# Patient Record
Sex: Male | Born: 1950 | Race: White | Hispanic: No | Marital: Married | Smoking: Never smoker
Health system: Southern US, Community
[De-identification: ages and names within clinical notes are randomized; demographics above are authoritative.]

## PROBLEM LIST (undated history)

## (undated) DIAGNOSIS — R011 Cardiac murmur, unspecified: Secondary | ICD-10-CM

## (undated) DIAGNOSIS — E119 Type 2 diabetes mellitus without complications: Secondary | ICD-10-CM

## (undated) DIAGNOSIS — M549 Dorsalgia, unspecified: Secondary | ICD-10-CM

## (undated) DIAGNOSIS — M1990 Unspecified osteoarthritis, unspecified site: Secondary | ICD-10-CM

## (undated) DIAGNOSIS — K219 Gastro-esophageal reflux disease without esophagitis: Secondary | ICD-10-CM

## (undated) DIAGNOSIS — G8929 Other chronic pain: Secondary | ICD-10-CM

## (undated) DIAGNOSIS — E785 Hyperlipidemia, unspecified: Secondary | ICD-10-CM

## (undated) DIAGNOSIS — I2510 Atherosclerotic heart disease of native coronary artery without angina pectoris: Secondary | ICD-10-CM

## (undated) DIAGNOSIS — C4490 Unspecified malignant neoplasm of skin, unspecified: Secondary | ICD-10-CM

## (undated) DIAGNOSIS — K449 Diaphragmatic hernia without obstruction or gangrene: Secondary | ICD-10-CM

## (undated) DIAGNOSIS — K279 Peptic ulcer, site unspecified, unspecified as acute or chronic, without hemorrhage or perforation: Secondary | ICD-10-CM

## (undated) DIAGNOSIS — I10 Essential (primary) hypertension: Secondary | ICD-10-CM

## (undated) HISTORY — PX: CORONARY ANGIOPLASTY WITH STENT PLACEMENT: SHX49

## (undated) HISTORY — DX: Gastro-esophageal reflux disease without esophagitis: K21.9

## (undated) HISTORY — PX: CORONARY ANGIOPLASTY: SHX604

## (undated) HISTORY — PX: ANAL FISSURE REPAIR: SHX2312

## (undated) HISTORY — PX: LIVER BIOPSY: SHX301

## (undated) HISTORY — PX: LAPAROSCOPIC CHOLECYSTECTOMY: SUR755

## (undated) HISTORY — PX: TONSILLECTOMY: SUR1361

## (undated) HISTORY — DX: Diaphragmatic hernia without obstruction or gangrene: K44.9

## (undated) HISTORY — PX: FRACTURE SURGERY: SHX138

## (undated) HISTORY — DX: Peptic ulcer, site unspecified, unspecified as acute or chronic, without hemorrhage or perforation: K27.9

## (undated) HISTORY — PX: BONY PELVIS SURGERY: SHX572

## (undated) HISTORY — DX: Atherosclerotic heart disease of native coronary artery without angina pectoris: I25.10

## (undated) HISTORY — DX: Hyperlipidemia, unspecified: E78.5

---

## 1983-12-13 HISTORY — PX: WRIST FRACTURE SURGERY: SHX121

## 1998-11-26 HISTORY — PX: CORONARY ARTERY BYPASS GRAFT: SHX141

## 1999-12-14 ENCOUNTER — Encounter: Payer: Self-pay | Admitting: Cardiology

## 1999-12-15 ENCOUNTER — Inpatient Hospital Stay (HOSPITAL_COMMUNITY): Admission: RE | Admit: 1999-12-15 | Discharge: 1999-12-22 | Payer: Self-pay | Admitting: Cardiology

## 1999-12-17 ENCOUNTER — Encounter: Payer: Self-pay | Admitting: Cardiology

## 1999-12-18 ENCOUNTER — Encounter: Payer: Self-pay | Admitting: Thoracic Surgery (Cardiothoracic Vascular Surgery)

## 1999-12-19 ENCOUNTER — Encounter: Payer: Self-pay | Admitting: Thoracic Surgery (Cardiothoracic Vascular Surgery)

## 1999-12-20 ENCOUNTER — Encounter: Payer: Self-pay | Admitting: Thoracic Surgery (Cardiothoracic Vascular Surgery)

## 2000-01-21 ENCOUNTER — Encounter
Admission: RE | Admit: 2000-01-21 | Discharge: 2000-04-20 | Payer: Self-pay | Admitting: Thoracic Surgery (Cardiothoracic Vascular Surgery)

## 2001-05-27 ENCOUNTER — Ambulatory Visit (HOSPITAL_COMMUNITY): Admission: RE | Admit: 2001-05-27 | Discharge: 2001-05-27 | Payer: Self-pay | Admitting: Internal Medicine

## 2001-09-09 ENCOUNTER — Ambulatory Visit (HOSPITAL_COMMUNITY): Admission: RE | Admit: 2001-09-09 | Discharge: 2001-09-09 | Payer: Self-pay | Admitting: Cardiology

## 2001-09-09 ENCOUNTER — Encounter: Payer: Self-pay | Admitting: Cardiology

## 2002-04-23 ENCOUNTER — Encounter: Payer: Self-pay | Admitting: Internal Medicine

## 2002-04-23 ENCOUNTER — Observation Stay (HOSPITAL_COMMUNITY): Admission: EM | Admit: 2002-04-23 | Discharge: 2002-04-24 | Payer: Self-pay | Admitting: Family Medicine

## 2002-04-29 ENCOUNTER — Observation Stay (HOSPITAL_COMMUNITY): Admission: RE | Admit: 2002-04-29 | Discharge: 2002-04-30 | Payer: Self-pay | Admitting: General Surgery

## 2002-04-29 ENCOUNTER — Encounter (INDEPENDENT_AMBULATORY_CARE_PROVIDER_SITE_OTHER): Payer: Self-pay | Admitting: Specialist

## 2002-04-29 ENCOUNTER — Encounter: Payer: Self-pay | Admitting: General Surgery

## 2003-09-02 ENCOUNTER — Ambulatory Visit (HOSPITAL_COMMUNITY): Admission: RE | Admit: 2003-09-02 | Discharge: 2003-09-02 | Payer: Self-pay | Admitting: Family Medicine

## 2003-09-26 ENCOUNTER — Emergency Department (HOSPITAL_COMMUNITY): Admission: EM | Admit: 2003-09-26 | Discharge: 2003-09-26 | Payer: Self-pay | Admitting: Emergency Medicine

## 2003-10-03 ENCOUNTER — Ambulatory Visit (HOSPITAL_COMMUNITY): Admission: RE | Admit: 2003-10-03 | Discharge: 2003-10-03 | Payer: Self-pay | Admitting: Family Medicine

## 2003-12-27 ENCOUNTER — Ambulatory Visit: Payer: Self-pay | Admitting: Cardiology

## 2004-09-27 ENCOUNTER — Ambulatory Visit (HOSPITAL_COMMUNITY): Admission: RE | Admit: 2004-09-27 | Discharge: 2004-09-27 | Payer: Self-pay | Admitting: Family Medicine

## 2004-12-17 ENCOUNTER — Ambulatory Visit: Payer: Self-pay | Admitting: Cardiology

## 2005-12-06 ENCOUNTER — Ambulatory Visit: Payer: Self-pay | Admitting: Cardiology

## 2005-12-16 ENCOUNTER — Ambulatory Visit: Payer: Self-pay

## 2006-10-20 ENCOUNTER — Ambulatory Visit: Payer: Self-pay | Admitting: Cardiology

## 2006-12-22 ENCOUNTER — Encounter: Payer: Self-pay | Admitting: Internal Medicine

## 2007-01-30 ENCOUNTER — Encounter: Payer: Self-pay | Admitting: Internal Medicine

## 2007-11-19 ENCOUNTER — Ambulatory Visit: Payer: Self-pay | Admitting: Cardiology

## 2007-12-15 ENCOUNTER — Ambulatory Visit: Payer: Self-pay | Admitting: Cardiology

## 2007-12-15 ENCOUNTER — Ambulatory Visit: Payer: Self-pay

## 2007-12-15 LAB — CONVERTED CEMR LAB
BUN: 20 mg/dL (ref 6–23)
Chloride: 102 meq/L (ref 96–112)
Eosinophils Relative: 1.9 % (ref 0.0–5.0)
Glucose, Bld: 161 mg/dL — ABNORMAL HIGH (ref 70–99)
INR: 1 (ref 0.8–1.0)
Lymphocytes Relative: 22.6 % (ref 12.0–46.0)
Monocytes Relative: 10 % (ref 3.0–12.0)
Neutrophils Relative %: 65.4 % (ref 43.0–77.0)
Platelets: 266 10*3/uL (ref 150–400)
Potassium: 3.6 meq/L (ref 3.5–5.1)
WBC: 6.8 10*3/uL (ref 4.5–10.5)

## 2007-12-16 ENCOUNTER — Ambulatory Visit: Payer: Self-pay | Admitting: Cardiology

## 2007-12-16 ENCOUNTER — Inpatient Hospital Stay (HOSPITAL_BASED_OUTPATIENT_CLINIC_OR_DEPARTMENT_OTHER): Admission: RE | Admit: 2007-12-16 | Discharge: 2007-12-16 | Payer: Self-pay | Admitting: Cardiology

## 2008-01-04 ENCOUNTER — Ambulatory Visit: Payer: Self-pay | Admitting: Cardiology

## 2008-08-31 ENCOUNTER — Encounter: Payer: Self-pay | Admitting: Cardiology

## 2008-09-27 ENCOUNTER — Encounter (INDEPENDENT_AMBULATORY_CARE_PROVIDER_SITE_OTHER): Payer: Self-pay | Admitting: *Deleted

## 2008-10-04 ENCOUNTER — Ambulatory Visit (HOSPITAL_COMMUNITY): Admission: RE | Admit: 2008-10-04 | Discharge: 2008-10-04 | Payer: Self-pay | Admitting: Advanced Practice Midwife

## 2008-11-21 DIAGNOSIS — Z8711 Personal history of peptic ulcer disease: Secondary | ICD-10-CM

## 2008-11-21 DIAGNOSIS — K449 Diaphragmatic hernia without obstruction or gangrene: Secondary | ICD-10-CM | POA: Insufficient documentation

## 2008-11-21 DIAGNOSIS — E785 Hyperlipidemia, unspecified: Secondary | ICD-10-CM

## 2008-11-21 DIAGNOSIS — K219 Gastro-esophageal reflux disease without esophagitis: Secondary | ICD-10-CM

## 2008-11-21 DIAGNOSIS — E118 Type 2 diabetes mellitus with unspecified complications: Secondary | ICD-10-CM

## 2008-11-23 ENCOUNTER — Telehealth (INDEPENDENT_AMBULATORY_CARE_PROVIDER_SITE_OTHER): Payer: Self-pay | Admitting: *Deleted

## 2008-12-16 ENCOUNTER — Ambulatory Visit: Payer: Self-pay | Admitting: Cardiology

## 2008-12-16 DIAGNOSIS — I10 Essential (primary) hypertension: Secondary | ICD-10-CM | POA: Insufficient documentation

## 2009-05-04 ENCOUNTER — Telehealth (INDEPENDENT_AMBULATORY_CARE_PROVIDER_SITE_OTHER): Payer: Self-pay | Admitting: *Deleted

## 2009-11-15 ENCOUNTER — Telehealth: Payer: Self-pay | Admitting: Cardiology

## 2009-11-20 ENCOUNTER — Telehealth: Payer: Self-pay | Admitting: Cardiology

## 2009-12-11 ENCOUNTER — Ambulatory Visit: Payer: Self-pay | Admitting: Cardiology

## 2009-12-12 LAB — CONVERTED CEMR LAB
BUN: 22 mg/dL (ref 6–23)
Bilirubin, Direct: 0.1 mg/dL (ref 0.0–0.3)
Chloride: 103 meq/L (ref 96–112)
Cholesterol: 141 mg/dL (ref 0–200)
Glucose, Bld: 148 mg/dL — ABNORMAL HIGH (ref 70–99)
HDL: 36.9 mg/dL — ABNORMAL LOW (ref >39.00)
Hgb A1c MFr Bld: 7.5 % — ABNORMAL HIGH (ref 4.6–6.5)
LDL Cholesterol: 78 mg/dL (ref 0–99)
Potassium: 4.4 meq/L (ref 3.5–5.1)
Total Protein: 6.5 g/dL (ref 6.0–8.3)
VLDL: 26.6 mg/dL (ref 0.0–40.0)

## 2009-12-14 ENCOUNTER — Ambulatory Visit: Payer: Self-pay | Admitting: Cardiology

## 2010-03-27 NOTE — Progress Notes (Signed)
Summary: re labwork   Phone Note Call from Patient   Caller: Spouse [phone] Reason for Call: Talk to Nurse Summary of Call: pt's wife, Lowella Bandy, called to set up appt/wants to know if he needs labwork as well  Initial call taken by: Glynda Jaeger,  November 15, 2009 2:11 PM  Follow-up for Phone Call        I spoke with the pt's wife and the pt's PCP Dr Nobie Putnam retired.  The pt is not following with a PCP at this time.   The pt will need a FASTING Lipid and Liver profile prior to his appt with Dr Daleen Squibb.  I will forward this message to Dr Daleen Squibb to see if he would like to order a BMP, HgbA1C or any other labs prior to appt.  The pt will come into the office a few days prior to appt for labs.  Follow-up by: Julieta Gutting, RN, BSN,  November 15, 2009 2:42 PM     Appended Document: re labwork ok with me

## 2010-03-27 NOTE — Progress Notes (Signed)
   Recieved requeast from [employer] for records forwarded to [employer] for [employer]  May 04, 2009 8:50 AM

## 2010-03-27 NOTE — Progress Notes (Signed)
Summary: RX REFILLS   Phone Note Call from Patient Call back at Home Phone [phone]   Caller: PT Reason for Call: Refill Medication Summary of Call: PT NEEDS ALL RX CALLED TO [facility]. TRANDOLAPRIL 4MG , HCTZ 12.5MG , TRICARE 145MG , ATENOLOL 50MG  ALL FOR #90 DAYS Initial call taken by: Faythe Ghee,  November 20, 2009 2:38 PM  Follow-up for Phone Call        Pt aware. Mylo Red RN    Prescriptions: HYDROCHLOROTHIAZIDE 12.5 MG CAPS (HYDROCHLOROTHIAZIDE) 1 cap once daily  #90 x 3   Entered by:   Lisabeth Devoid RN   Authorized by:   Gaylord Shih, MD, [facility]   Signed by:   Lisabeth Devoid RN on 11/20/2009   Method used:   Faxed to ...       [facility] (mail-order)             , [place]         Ph: [phone]       Fax: [phone]   RxID:   [id number] ATENOLOL 50 MG TABS (ATENOLOL) 1 tab once daily  #90 x 3   Entered by:   Lisabeth Devoid RN   Authorized by:   Gaylord Shih, MD, [facility]   Signed by:   Lisabeth Devoid RN on 11/20/2009   Method used:   Faxed to ...       [facility] (mail-order)             , [place]         Ph: [phone]       Fax: [phone]   RxID:   [id number] MAVIK 4 MG TABS (TRANDOLAPRIL) 1 tab once daily  #90 x 3   Entered by:   Lisabeth Devoid RN   Authorized by:   Gaylord Shih, MD, [facility]   Signed by:   Lisabeth Devoid RN on 11/20/2009   Method used:   Faxed to ...       [facility] (mail-order)             , [place]         Ph: [phone]       Fax: [phone]   RxID:   [id number] TRICOR 145 MG TABS (FENOFIBRATE) 1 tab once daily  #90 x 3   Entered by:   Lisabeth Devoid RN   Authorized by:   Gaylord Shih, MD, [facility]   Signed by:   Lisabeth Devoid RN on 11/20/2009   Method used:   Faxed to ...       [facility] (mail-order)             , [place]         Ph: [phone]       Fax: [phone]   RxID:   [id number]

## 2010-03-27 NOTE — Assessment & Plan Note (Signed)
Summary: PER CHECK OUT/SF  Medications Added GLIPIZIDE XL 5 MG XR24H-TAB (GLIPIZIDE) 1 tab once daily METFORMIN HCL 500 MG TABS (METFORMIN HCL) 1 tab two times a day        Visit Type:  1 yr f/u Primary Deneka Greenwalt:  Dorthey Sawyer  CC:  pt offers no cardiac complaints today.  History of Present Illness: Mr. Vancuren comes in today for followup and management of his coronary disease, history of coronary bypass surgery, mixed hyperlipidemia.  He's having no angina or ischemic symptoms. He is active he running. His weights gotten up a little bit.  Lipid profile on the Oct 17 showed a  total cholesterol 141, triglycerides 133, HDL 36.9, LDL 78. LFTs were normal. Hemoglobin A1c was suboptimal at 7.5%. Electrolytes and LFTs were within normal limits.  Clinical Reports Reviewed:  Cardiac Cath:  12/16/2007: Cardiac Cath Findings:   ASSESSMENT:  Severe native vessel coronary artery disease with patent   vein graft to the obtuse marginal, sequential vein graft to an acute   marginal and the posterior descending artery, and left internal mammary   artery to the left anterior descending.  The patient did have a positive   stress test with some ischemia in the septal area.  The only area that   looks from the angiography like it possibly could be contributing to   ischemia would be the area supplied by the 2 small-to-medium diagonals   and several septal perforators that are proximal to the 70% stenosis in   the mid left anterior descending that is itself just proximal to the   left internal mammary artery touchdown.  As the patient is not having   very significant symptoms, I think it would be reasonable to continue   medical management at   this point.  If his symptoms do become intractable, it may be possible   to perform percutaneous coronary intervention on the mid left anterior   descending lesion via the left internal mammary artery to try to provide   better flow to the 2 diagonals.  The  plan for now will be continued   medical management.               Marca Ancona, MD   Electronically Signed   12/14/1999: Cardiac Cath Findings:  Conclusions: 1. Preserved left ventricular function. 2. No evidence of significant restenosis at the previous stent site. 3. Significant progression of disease with three-vessel coronary artery disease.  Disposition: The patient has diabetes. The LAD is segmentally and diffusely diseased, and the target vessel revascularization would be extremely high if this were stented. I reviewed the films with Dr. Andee Lineman and Dr. Gerri Spore. Based on our discussions, we would favor revascularization surgery.  Shawnie Pons, MD  Nuclear Study:  12/15/2007:  Excerise capacity: Good exercise capacity  Blood Pressure response: Normal blood pressure response  Clinical symptoms: There is chest pain  ECG impression: Significant ST abnormalities consistent with ischemia (ST elevation V1 of 1 to 2 mm).  Overall impression: Abnormal stress nuclear study with mild to moderate ischemia in the aeptum.  Olga Millers, MD   12/16/2005:  Excerise capacity: Good exercise capacity  Blood Pressure response: Normal blood pressure response  Clinical symptoms: There is dyspnea  ECG impression: No significant ST segment change suggestive of ischemia. Overall impression: There is no sign of scar or ischemia.  Olga Millers, MD   09/09/2001:   REPORT:  CLINICAL DATA:  63-YEAR-OLD GENTLEMAN WITH KNOWN   CORONARY DISEASE AND PRIOR  CABG SURGERY; NOW WITH   RECURRENT CHEST PAIN   STRESS CARDIOLITE  RADIONUCLIDE DATA:  ONE DAY REST/STRESS PROTOCOL PERFORMED   WITH 10.0/30.0 mCi Tc50m SESTAMIBI.   STRESS DATA:  TREADMILL EXERCISE PERFORMED WITH ADEQUATE   EXERCISE TOLERANCE AND NO SYMPTOMS NOR   ELECTROCARDIOGRAPHIC EVIDENCE FOR MYOCARDIAL ISCHEMIA -   PLEASE SEE SEPARATE REPORT.   SCINTIGRAPHIC DATA:  ACQUISITION NOTABLE FOR MINOR MOTION   AND MILD  DIAPHRAGMATIC ATTENUATION.  LEFT VENTRICULAR SIZE   IS AT THE UPPER LIMIT OF NORMAL.  ON TOMOGRAPHIC IMAGES   RECONSTRUCTED AND STANDARD PLANES, THERE WAS UNIFORM AND   NORMAL UPTAKE OF TRACER IN ALL MYOCARDIAL SEGMENTS.  THE   RESTING IMAGES WERE UNCHANGED.  THE GATED 3D   RECONSTRUCTION REVEALED NORMAL REGION AND GLOBAL LV   SYSTOLIC FUNCTION WITH AN ESTIMATED EJECTION FRACTION OF   .65.  THE GATED TOMOGRAMS WERE NORMAL.   IMPRESSION:   NEGATIVE STRESS CARDIOLITE STUDY WITH NO SIGNIFICANT   STRESS-INDUCED EKG ABNORMALITIES, NORMAL LEFT VENTRICULAR   SIZE AND FUNCTION, AND NORMAL MYOCARDIAL PERFUSION BY   SCINTIGRAPHIC IMAGING.   CC:  DR. MARK CRESENZO   TRANSCRIBED DATE:  MA  TA       09/11/2001 1:51 pm                                           Read By:  Gerrit Friends. Dietrich Pates, M.D.                                           Released By:   Gerrit Friends. Rothbart   Current Medications (verified): 1)  Tricor 145 Mg Tabs (Fenofibrate) .Marland Kitchen.. 1 Tab Once Daily 2)  Mavik 4 Mg Tabs (Trandolapril) .Marland Kitchen.. 1 Tab Once Daily 3)  Atenolol 50 Mg Tabs (Atenolol) .Marland Kitchen.. 1 Tab Once Daily 4)  Niaspan 500 Mg Cr-Tabs (Niacin (Antihyperlipidemic)) .Marland Kitchen.. 1 Tab Once Daily 5)  Glipizide Xl 5 Mg Xr24h-Tab (Glipizide) .Marland Kitchen.. 1 Tab Once Daily 6)  Nexium 40 Mg Cpdr (Esomeprazole Magnesium) .Marland Kitchen.. 1 Tab Once Daily 7)  Centrum Silver  Tabs (Multiple Vitamins-Minerals) .Marland Kitchen.. 1 Tab Once Daily 8)  Aspirin Ec 325 Mg Tbec (Aspirin) .... Take One Tablet By Mouth Daily 9)  Metformin Hcl 500 Mg Tabs (Metformin Hcl) .Marland Kitchen.. 1 Tab Two Times A Day 10)  Hydrochlorothiazide 12.5 Mg Caps (Hydrochlorothiazide) .Marland Kitchen.. 1 Cap Once Daily 11)  Vytorin 10-20 Mg Tabs (Ezetimibe-Simvastatin) .Marland Kitchen.. 1 Tab Once Daily 12)  Relafen 750mg  .... As Needed  Allergies: 1)  ! * Niaspan (Short Acting)  Past History:  Past Medical History: Last updated: Dec 04, 2008 CAD, ARTERY BYPASS GRAFT/ 11/1999 (ICD-414.04) FAMILY HISTORY OF CAD MALE 1ST DEGREE  RELATIVE <60 (ICD-V16.49) HYPERLIPIDEMIA-MIXED (ICD-272.4) GERD (ICD-530.81) PUD, HX OF (ICD-V12.71) HIATAL HERNIA (ICD-553.3) DIABETES MELLITUS, TYPE II (ICD-250.00)    Past Surgical History: Last updated: 2008/12/04 CABG x 4 .. Dr. Andrey Spearman in October of 2001. Cholecystectomy Status post right wrist surgery. Status post rectal fistula repair.  Family History: Last updated: Dec 04, 2008  Father died at age 44 of coronary artery disease.  Mother  age 44, has coronary artery disease.  Social History: Last updated: 12/04/08  He does not smoke, does not drink alcohol.  He is married  with one child.  Risk Factors: Smoking Status: quit (12/22/2006)  Review of Systems       negative other than history of present illness  Vital Signs:  Patient profile:   60 year old male Height:      68 inches Weight:      200.8 pounds BMI:     30.64 Pulse rate:   68 / minute Pulse rhythm:   irregular BP sitting:   120 / 70  (left arm) Cuff size:   large  Vitals Entered By: Danielle Rankin, CMA (December 14, 2009 3:45 PM)  Physical Exam  General:  obese.  muscular, in no acute distress Head:  normocephalic and atraumatic Eyes:  PERRLA/EOM intact; conjunctiva and lids normal. Neck:  Neck supple, no JVD. No masses, thyromegaly or abnormal cervical nodes. Chest Wall:  no deformities or breast masses noted Lungs:  Clear bilaterally to auscultation and percussion. Heart:  the amount nondisplaced, normal S1-S2, regular rate and rhythm, carotid up strokes are equal without bruits. Msk:  Back normal, normal gait. Muscle strength and tone normal. Pulses:  pulses normal in all 4 extremities Extremities:  No clubbing or cyanosis. Neurologic:  Alert and oriented x 3. Skin:  Intact without lesions or rashes. Psych:  Normal affect.   EKG  Procedure date:  12/14/2009  Findings:      normal sinus rhythm, no acute changes  Impression & Recommendations:  Problem # 1:  CAD, ARTERY  BYPASS GRAFT/ 11/1999 (ICD-414.04) Assessment Unchanged Will continue to follow from a symptomatic standpoint. He had a false positive stress Myoview in the past. Recent catheter showed patent grafts. Symptoms reviewed and patient carries nitroglycerin at all times. His updated medication list for this problem includes:    Mavik 4 Mg Tabs (Trandolapril) .Marland Kitchen... 1 tab once daily    Atenolol 50 Mg Tabs (Atenolol) .Marland Kitchen... 1 tab once daily    Aspirin Ec 325 Mg Tbec (Aspirin) .Marland Kitchen... Take one tablet by mouth daily  Problem # 2:  HYPERTENSION, BENIGN (ICD-401.1) Assessment: Improved  His updated medication list for this problem includes:    Mavik 4 Mg Tabs (Trandolapril) .Marland Kitchen... 1 tab once daily    Atenolol 50 Mg Tabs (Atenolol) .Marland Kitchen... 1 tab once daily    Aspirin Ec 325 Mg Tbec (Aspirin) .Marland Kitchen... Take one tablet by mouth daily    Hydrochlorothiazide 12.5 Mg Caps (Hydrochlorothiazide) .Marland Kitchen... 1 cap once daily  Problem # 3:  HYPERLIPIDEMIA-MIXED (ICD-272.4) patient advised to decrease weight and carbs. No other changes. His updated medication list for this problem includes:    Tricor 145 Mg Tabs (Fenofibrate) .Marland Kitchen... 1 tab once daily    Niaspan 500 Mg Cr-tabs (Niacin (antihyperlipidemic)) .Marland Kitchen... 1 tab once daily    Vytorin 10-20 Mg Tabs (Ezetimibe-simvastatin) .Marland Kitchen... 1 tab once daily  Problem # 4:  DIABETES MELLITUS, TYPE II (ICD-250.00) Assessment: Deteriorated weight loss advised. His updated medication list for this problem includes:    Mavik 4 Mg Tabs (Trandolapril) .Marland Kitchen... 1 tab once daily    Glipizide Xl 5 Mg Xr24h-tab (Glipizide) .Marland Kitchen... 1 tab once daily    Aspirin Ec 325 Mg Tbec (Aspirin) .Marland Kitchen... Take one tablet by mouth daily    Metformin Hcl 500 Mg Tabs (Metformin hcl) .Marland Kitchen... 1 tab two times a day  Patient Instructions: 1)  Your physician recommends that you schedule a follow-up appointment in: 1 YEAR

## 2010-07-10 NOTE — Assessment & Plan Note (Signed)
[facility]                            CARDIOLOGY OFFICE NOTE   NUH, LIPTON                        MRN:          [id number]  DATE:01/04/2008                            DOB:          1950-12-01    [name] comes back today for positive stress test showing some septal  ischemia and subsequent outpatient catheterization.   Please see the cath note by Dr. Marca Ancona.  Essentially, all his  grafts were patent except for proximal 70% stenosis of the LAD which was  may be compromising septal perforator.  There is nothing to do for this  other than medical therapy.   Unfortunately, he is not having any symptoms.   He has had no problems with the catheterization itself.   His meds were unchanged since our last visit.   PHYSICAL EXAMINATION:  VITAL SIGNS:  Blood pressure is 127/80, his pulse  is 59 and regular, weight is 200, down 6.  HEENT:  Normal.  Carotids are full.  No bruits.  HEART:  Soft S1 and S2.  LUNGS:  Clear to auscultation and percussion.  Cath site is stable.  EXTREMITIES:  No cyanosis, clubbing, or edema.  Pulses are intact.   [name] is stable from our standpoint.  It is encouraging that his  grafts were all patent 9 years out from his surgery.  I have encouraged  him to lose weight and to begin regular exercise program walking on his  treadmill.  I will plan on seeing him back in a year.     Thomas C. Daleen Squibb, MD, [facility]  Electronically Signed    TCW/MedQ  DD: 01/04/2008  DT: 01/05/2008  Job #: [id number]   cc:   Patrica Duel, M.D.

## 2010-07-10 NOTE — Assessment & Plan Note (Signed)
[facility]                            CARDIOLOGY OFFICE NOTE   ARIAS, WEINERT                        MRN:          [id number]  DATE:11/19/2007                            DOB:          [birth year]    Melville comes in today for further management of following problems;  1. Coronary artery disease.  He is currently having no symptoms of      ischemia or angina.  He is due stress Myoview.  His last one showed      no ischemia, EF 64%.  He is about 9 years out from coronary artery      bypass grafting.  2. Hypertension.  3. Type 2 diabetes.  He states his blood sugars have been under pretty      good control.  4. Hyperlipidemia, at goal per Dr. Geanie Logan recent blood work.   His meds are unchanged since last year except he is no longer on folic  acid.   He denies any orthopnea, PND, or peripheral edema.   PHYSICAL EXAMINATION:  VITAL SIGNS:  Blood pressure today is 110/74, his  pulse is 70 and regular, his weight is 206.  HEENT:  Normal.  NECK:  Carotid upstrokes are equal bilaterally without bruits.  No JVD.  Thyroid is not enlarged.  Trachea is midline.  LUNGS:  Clear to auscultation and percussion.  HEART:  Soft S1 and S2.  PMI is difficult to appreciate.  ABDOMEN:  Soft with good bowel sounds.  EXTREMITIES:  No cyanosis, clubbing, or edema.  Pulses are intact.  NEURO:  Intact.  SKIN:  Unremarkable.   EKG shows normal sinus rhythm with poor progression across the anterior  precordium, no change.   ASSESSMENT AND PLAN:  Mr. Poehler is doing well.  We will arrange an  exercise rest stress Myoview off atenolol.  Assuming this is negative  for ischemia, we will see him back again in a year.     Thomas C. Daleen Squibb, MD, [facility]  Electronically Signed    TCW/MedQ  DD: 11/19/2007  DT: 11/20/2007  Job #: [id number]   cc:   Patrica Duel, M.D.

## 2010-07-10 NOTE — Cardiovascular Report (Signed)
NAME:  Chad Blair, Chad Blair NO.:  [id number]   MEDICAL RECORD NO.:  [id number]          PATIENT TYPE:  OIB   LOCATION:  1963                         FACILITY:  [facility]   PHYSICIAN:  Marca Ancona, MD      DATE OF BIRTH:  April 12, 1950   DATE OF PROCEDURE:  12/16/2007  DATE OF DISCHARGE:                            CARDIAC CATHETERIZATION   REFERRING PHYSICIAN:  Jesse Sans. Wall, MD, [facility]   PROCEDURES:  Left heart catheterization with coronary angiography,  bypass graft angiography, and left ventriculography.   INDICATIONS:  Status post coronary artery bypass grafting with positive  stress Myoview.   PROCEDURE IN DETAIL:  After informed consent was obtained, the right  groin was sterilely prepped and draped.  The right coronary was locally  anesthetized with 1% lidocaine.  The right common femoral artery was  entered using Seldinger technique and a 6-French arterial sheath was  placed in the right common femoral artery.  The left coronary artery,  the right coronary artery, and the vein graft to the obtuse marginal and  the vein graft sequential to the acute marginal and PDA were engaged  using the 6-French multipurpose catheter.  The left ventricle was  entered using the 6-French multipurpose catheter.  The LIMA graft was  engaged using the 6-French LIMA catheter.  There were no complications.   FINDINGS:  Hemodynamics:  LV 111/10/15, aorta 105/64/83.   Left ventriculography:  LVEF was estimated at 55%.  There was mild  hypokinesis of the basal inferior wall.   Coronary angiography:  The left main coronary artery was patent with no  significant obstructive disease.  The proximal LAD was totally occluded.  There was a patent small ramus artery.  The left circumflex artery was  patent.  The large first obtuse marginal had a long 40-50% proximal  stenosis.  There was competitive flow in that artery from the vein graft  to the first obtuse marginal.  The right coronary  artery was dominant.  There is a long stented segment in the proximal right coronary artery.  Within that segment, there is a 99% stenosis of the RCA.  The distal RCA  is totally occluded.  The vein graft to the first obtuse marginal is  patent with no significant obstructive disease.  The sequential vein  graft to an acute marginal and the PDA is patent.  There is a mild 30  narrowing in the proximal graft.  The LIMA to the LAD is patent.  Just  proximal to the touchdown of the LIMA, there is a 70% stenosis in the  LAD and more proximal to that there are 2 medium-sized diagonals.  The  distal runoff of the LAD after the LIMA is patent with no significant  obstructive disease.   ASSESSMENT:  Severe native vessel coronary artery disease with patent  vein graft to the obtuse marginal, sequential vein graft to an acute  marginal and the posterior descending artery, and left internal mammary  artery to the left anterior descending.  The patient did have a positive  stress test with some ischemia in  the septal area.  The only area that  looks from the angiography like it possibly could be contributing to  ischemia would be the area supplied by the 2 small-to-medium diagonals  and several septal perforators that are proximal to the 70% stenosis in  the mid left anterior descending that is itself just proximal to the  left internal mammary artery touchdown.  As the patient is not having  very significant symptoms, I think it would be reasonable to continue  medical management at  this point.  If his symptoms do become intractable, it may be possible  to perform percutaneous coronary intervention on the mid left anterior  descending lesion via the left internal mammary artery to try to provide  better flow to the 2 diagonals.  The plan for now will be continued  medical management.      Marca Ancona, MD  Electronically Signed     DM/MEDQ  D:  12/16/2007  T:  12/17/2007  Job:   [id number]   cc:   Thomas C. Wall, MD, [facility]

## 2010-07-10 NOTE — Assessment & Plan Note (Signed)
[facility]                            CARDIOLOGY OFFICE NOTE   Chad Blair, Chad Blair                        MRN:          [id number]  DATE:12/15/2007                            DOB:          [birth year]    CHIEF COMPLAINT:  Throat tightness.   HISTORY OF PRESENT ILLNESS:  Chad Blair is a 60 year old white male, long-  time patient of mine, who I saw in the office on November 19, 2007.  At  that time he had no significant complaints.  In retrospect, he does say  has some throat tightness on occasion which he attributes to some  bronchitis.   He had a stress Myoview today which showed him to have chest tightness  and pressure in his chest.  He had significant ST-segment changes with  ST-segment elevation in V1 of 1-2 mm that resolved with rest.  His EF is  62% but he had moderate ischemia in the septum.  This was reviewed with  Dr. Olga Millers.   His past medical history is significant for coronary bypass grafting x4  by Dr. Andrey Spearman in October of 2001.  At that time he had a left  internal mammary graft placed to the LAD, vein graft to an obtuse  marginal, vein graft sequentially to an acute marginal and posterior  descending.  He had normal left ventricular function.   He has had previous stents and instant restenosis of the right coronary  artery.   Other problems include mixed hyperlipidemia, hiatal hernia,  gastroesophageal reflux, remote peptic ulcer disease, and type 2  diabetes.   His current meds include Mavik 4 mg daily, atenolol 50 mg a day, Tricor  145 mg a day, Niaspan 500 mg a day, glipizide 5 mg a day, Nexium 40 mg a  day, Centrum Silver, aspirin 325 mg a day, Glucophage 1000 mg q.h.s.,  HCTZ 12.5 mg a day, Vytorin 10/20 daily, glucosamine chondroitin 2  daily.   HE HAS NO HISTORY OF DYE ALLERGIES.  HE HAS HAD FLUSHING IN THE PAST  WITH SHORT-ACTING NIACIN.   FAMILY HISTORY:  Noncontributory.   SOCIAL HISTORY:  See  previous charts.   REVIEW OF SYSTEMS:  Negative other than the HPI.   EXAM:  His blood pressure is 110/74, his pulse is 70 and regular.  His  weight is 206.  HEENT:  Normal.  NECK:  Carotid upstrokes were equal bilaterally without bruits.  No JVD.  Thyroid is not enlarged.  Trachea is midline.  LUNGS:  Clear to auscultation and percussion.  HEART:  Revealed a normal S1 and S2.  PMI was difficult to appreciate.  ABDOMINAL EXAM:  Soft.  Good bowel sounds.  No obvious organomegaly.  EXTREMITIES:  No cyanosis, clubbing or edema.  Pulses are intact.  NEURO:  Exam is intact.  SKIN:  Unremarkable.   EKG shows sinus rhythm with poor R wave progression across the anterior  precordium, which is no change.   ASSESSMENT:  Chest tightness and throat tightness with extreme exertion.  A stress Myoview shows moderate septal ischemia.  Rule out progressive  coronary disease or graft disease.   PLAN:  Outpatient cardiac catheterization.   The indications, risks and potential benefits have been discussed.  The  patient agrees to proceed.     Thomas C. Daleen Squibb, MD, [facility]  Electronically Signed    TCW/MedQ  DD: 12/15/2007  DT: 12/15/2007  Job #: [id number]   cc:   Patrica Duel, M.D.

## 2010-07-10 NOTE — Assessment & Plan Note (Signed)
[facility]                            CARDIOLOGY OFFICE NOTE   DUNCAN, ALEJANDRO                        MRN:          [id number]  DATE:10/20/2006                            DOB:          05-26-1950    Mr. Dykman returns today for further management of the following issues:  1. Coronary artery disease. He has had some symptoms of indigestion      recently, his wife wanted him to go get it checked out. He took a      week of vacation last week and felt the best he has felt. He is      really not having any exertional symptoms and he has had no further      discomfort. His last stress Myoview was last year and was negative      for ischemia or scar. His ejection fraction was 64%.  2. Hypertension, followed by Dr. Nobie Putnam.  3. Type 2 diabetes, he is due for follow up A1C level with Dr.      Nobie Putnam in the next week or 2.  4. Hyperlipidemia, followed by Dr. Nobie Putnam.   His meds are unchanged since last year except he has had Glucophage 1000  mg at bedtime ordered.   His blood pressure today is 122/78, his pulse is 65 and regular, his  weight is 207 down 2.  HEENT: Ruddy complexion. PERRLA: Extraocular movements intact. Sclera  clear. Fascial symmetry is normal. Carotids are full. There is no JVD.  Thyroid is not enlarged. Trachea is midline. There is no bruits.  LUNGS: Clear to auscultation.  HEART: Reveals poorly appreciated PMI. Sternotomy site is stable.  Reveals soft S1, S2.  ABDOMINAL EXAM: Soft, good bowel sounds. No midline bruits.  EXTREMITIES: Reveal no cyanosis, clubbing, or edema. Pulses are intact.  NEURO EXAM: Intact.   Electrocardiogram shows sinus rhythm with no changes and stable since  last tracing.   ASSESSMENT/PLAN:  I think Mr. Milliron is doing well. One episode of  discomfort I think was indigestion. He is doing well from our  perspective.   I renewed his medications. I have asked him to tighten up his blood  sugar as much  as possible. We will plan on seeing him back in a year  unless he has problems in the meantime.   He is taking folic acid for his heart; I have asked him to discontinue  it. All __________ has been pretty much discredited.     Thomas C. Daleen Squibb, MD, [facility]  Electronically Signed   TCW/MedQ  DD: 10/20/2006  DT: 10/21/2006  Job #: [id number]   cc:   Patrica Duel, M.D.

## 2010-07-13 NOTE — Discharge Summary (Signed)
[facility]  Patient:    Chad Blair, Chad Blair                       MRN: [id number] Adm. Date:  12/14/99 Disc. Date: 12/22/99 Attending:  Salvatore Decent. Dorris Fetch, M.D. Dictator:   Carlye Grippe. CC:         Jesse Sans. Daleen Squibb, M.D. [facility]  Patrica Duel, M.D.   Discharge Summary  HISTORY OF PRESENT ILLNESS:  This is a 60 year old male with known coronary artery disease, referred by Patrica Duel, M.D., for cardiac catheterization to the [facility].  The patient reports a two to three week history of exertional upper chest discomfort that was dull and had bilateral shoulder radiation, as well as diaphoresis and shortness of breath that was relieved with rest.  He denied any rest pain.  He had one episode on the Monday prior to admission that lasted approximately three hours with relief after sublingual nitroglycerin.  He was started on Imdur the next day.  The symptoms were similar to his prior episodes before his percutaneous interventions.  He was admitted this hospitalization for cardiac catheterization and further evaluation and treatment pending those results.  PAST MEDICAL HISTORY:  Previous stenting of the right coronary artery with end-stent restenosis and PTCA.  Other diagnoses include hyperlipidemia, hiatal hernia, gastroesophageal reflux, remote peptic ulcer disease, and newly diagnosed type 2 diabetes mellitus.  MEDICATIONS ON ADMISSION: 1. Imdur, questionable dose, q.d. 2. Toprol 50 mg q.d. 3. Norvasc 5 mg q.d. 4. Enteric-coated aspirin one q.d. 5. Lipitor 20 mg q.d. 6. Glucotrol XL 2.5 mg q.d. 7. Nitroglycerin p.r.n.  FAMILY HISTORY:  Please see the history and physical done at the time of admission.  SOCIAL HISTORY:  Please see the history and physical done at the time of admission.  REVIEW OF SYSTEMS:  Please see the history and physical done at the time of admission.  PHYSICAL EXAMINATION:  Please see the history  and physical done at the time of admission.  HOSPITAL COURSE:  The patient was admitted and underwent cardiac catheterization on November 14, 1999.  Findings included three-vessel coronary artery disease with normal left ventricular function.  It was felt that coronary revascularization was his best option and a surgical consultation was obtained with Viviann Spare C. Dorris Fetch, M.D., who evaluated the patient and studies and agreed with recommendations and the procedure was scheduled.  On December 18, 1999, the patient underwent the following procedure:  Coronary artery bypass grafting x 4.  The following grafts were placed:  1. Left internal mammary artery to the LAD.  2. Saphenous vein graft to the obtuse marginal.  3. Sequential saphenous vein graft to the acute marginal and posterior descending.  The cross clamp time was 47 minutes.  The patient was taken to the surgical intensive care unit in stable condition.  During the postoperative hospital course, the patient has done well.  He awoke neurologically intact and was extubated per protocol.  Routine lines and monitors were discontinued in a stepwise manner.  The postoperative EKG did show a new right bundle branch block.  Laboratory values on postoperative day #1 were stable with a hematocrit of 32% and normal electrolytes, BUN, and creatinine.  The patients Swan-Ganz and sleeve were discontinued as were his chest tube, arterial line, and Foley and he was transferred to the 2000 telemetry unit on postoperative day #1 for a continued recovery.  His glucose monitoring has been stable.  He  initially was on the Glucometer with Lantus Insulin and sliding scale, but has stabilized on oral sulfonyurea therapy.  In regards to his cardiac rhythm, he maintains a sinus rhythm with sinus tachycardia at times and beta blocker has been adjusted for rate.  His incisions are healing quite well without signs of infection.  He is tolerating activity and  diet commensurate for level of postoperative convalescence. Overall the patient is felt to be quite stable for tentative discharge in the morning of December 22, 1999, pending morning round re-evaluation.  MEDICATIONS ON DISCHARGE: 1. Enteric-coated aspirin one q.d. 2. Toprol XL 100 mg daily. 3. Protonix 40 mg daily. 4. Tylox one to two every four to six hours as needed for pain. 5. Multivitamins one daily. 6. Glipizide 2.5 mg daily.  DISCHARGE INSTRUCTIONS:  The patient will receive written instructions regarding medications, activity, diet, wound care, and follow-up.  FOLLOW-UP:  Will include Thomas C. Daleen Squibb, M.D., on January 07, 2000, and Salvatore Decent. Dorris Fetch, M.D., in three weeks.  Additionally, he will follow up at [facility] diabetes.  FINAL DIAGNOSES: 1. Severe progressive three-vessel coronary artery disease, status post    previous percutaneous transluminal coronary angioplasty and stenting. 2. Hyperlipidemia. 3. History of hiatal hernia. 4. Gastroesophageal reflux. 5. History of peptic ulcer disease. 6. History of diabetes mellitus, type 2. DD:  12/21/99 TD:  12/22/99 Job: [id number]

## 2010-07-13 NOTE — Op Note (Signed)
NAME:  Chad Blair, Chad Blair                           ACCOUNT NO.:  [id number]   MEDICAL RECORD NO.:  [id number]                   PATIENT TYPE:  AMB   LOCATION:  DAY                                  FACILITY:  [facility]   PHYSICIAN:  Timothy E. Earlene Plater, M.D.              DATE OF BIRTH:  11-15-1950   DATE OF PROCEDURE:  04/29/2002  DATE OF DISCHARGE:                                 OPERATIVE REPORT   PREOPERATIVE DIAGNOSIS:  Acute and chronic cholecystolithiasis.   POSTOPERATIVE DIAGNOSIS:  Acute and chronic cholecystolithiasis.   PROCEDURES:  1. Laparoscopic cholecystectomy.  2. Operative cholangiogram.   SURGEON:  Timothy E. Earlene Plater, M.D.   ASSISTANT:  Currie Paris, M.D.   ANESTHESIA:  CRNA supervised by Jill Side, M.D.   INDICATIONS FOR PROCEDURE:  The patient is known to me; he is 43, has  significant cardiovascular disease with a previous coronary artery bypass.  He is cared for by Dr. Juanito Doom at [place]. He had his first episode of  right upper quadrant mid epigastric pain on February 27 and 28, 2004; was  admitted to [facility]. Cardiac evaluation was negative. Ultrasound  showed gallstones. CBC and liver function studies were normal. He presents  now for urgent cholecystectomy. He agrees and understands the procedure. He  was evaluated by anesthesia, identified and permit signed.   DESCRIPTION OF PROCEDURE:  He was taken to the operating room, placed  supine, and general endotracheal anesthesia was administered. The abdomen  was shaved, prepped and draped in the usual sterile fashion. Marcaine 0.25%  with epinephrine was used throughout for local anesthesia.   An infraumbilical incision was made, the fascia identified, opened in the  midline.  The peritoneum was entered without complication and the trocar  inserted and tied in place with a #1 Vicryl.  The abdomen was insufflated,  general peritoneoscopy was unremarkable. A second 10 mm trocar was placed  in  the mid epigastrium, two 5 mm trocars in the right upper quadrant. The  gallbladder was inflamed and it was slightly tense.  It was grasped at the  dome, placed on traction. The omental adhesions were taken down bluntly with  cautery and the entire gallbladder visualized.   A second grasper was placed on the lower portion of the gallbladder body and  the gallbladder was ruptured.  That was controlled with the clamp. Careful  dissection at the base of the gallbladder revealed an anterior cystic duct  of normal appearance. It was dissected out completely, clipped near the  gallbladder, opened and then using the Reddick catheter and real time  fluoroscopy, a cholangiogram was obtained. This showed a normal biliary tree  with free flow of dye into the duodenum and the cystic duct entering the  common duct. We thought this was normal.   The cholangiogram catheter was removed.  The cystic duct was triply clipped  on the  remnant and it was divided. Behind this were a large and a small  cystic artery.  These were isolated, clipped and divided. As the gallbladder  was being dissected out of the bed of the gallbladder, a venous lake was  encountered to which hemostatic pressure was applied. The gallbladder was  removed from the gallbladder bed without further incident or complication  except for the previous rupture. Traction was held and the venous lake was  identified, observed, irrigated and there was no leak and no bleeding.  Surgicel was left in the bed of the gallbladder.   The decompressed gallbladder which was full of thick bile and stone  fragments was placed in an EndoCatch bag and removed from the abdomen  through the infraumbilical incision which was then closed with the  previously placed #1 Vicryl suture. Copious irrigation was carried out until  completely clear. All areas were inspected, all areas were dry. A 19 mm  Blake was placed in the bed of the gallbladder and brought  out through the  lateral trocar site. It was tied in place with a nylon suture. All counts  were correct. All irrigant and CO2, trocars and instruments removed under  direct vision. The abdomen was flat and soft. The skin incisions were  inspected and then closed with interrupted 3-0 Monocryl. Final counts  correct.   He tolerated it well, was awakened and taken to the recovery room in good  condition.                                               Timothy E. Earlene Plater, M.D.    TED/MEDQ  D:  04/29/2002  T:  04/29/2002  Job:  [id number]   cc:   Jesse Sans. Wall, M.D. [facility]

## 2010-07-13 NOTE — Assessment & Plan Note (Signed)
[facility]                              CARDIOLOGY OFFICE NOTE   CAIDEN, ARTEAGA                        MRN:          [id number]  DATE:12/06/2005                            DOB:          02/05/1951    Mr. Fukushima returns today for management of the following issues.  1. Coronary artery disease.  He is status post coronary artery bypass      grafting.  He is having no angina or ischemic equivalents.  He has not      had a stress test in 2 years.  2. Obesity.  He has maintained his weight, but is up about 3 pounds from      last year.  3. Mixed hyperlipidemia.  He is on quadruple drug therapy with Dr.      Nobie Putnam.  He is due a physical and drug work.  I advised him to get      his liver panel checked every 6 months.  His numbers have been      remarkably good.  4. Borderline hypertension.  His blood pressure has been under good      control.  He is on an ACE inhibitor.   MEDICATIONS:  1. Hydrochlorothiazide 12.5 mg a day.  2. Mavik 4 mg a day.  3. Atenolol 50 mg a day.  4. TriCor 145 mg q. day.  5. Niaspan 500 mg a day.  6. Vytorin 10/20 one-half of the tablet q. day.  7. Glipizide 5 mg a day.  8. Nexium 40 mg a day.  9. Folic acid 400 mcg a day.  10  Centrum Silver q. day.  1. Aspirin 325 a day.   His blood pressure today is 116/84, his pulse is 52, and he is in sinus  brady.  EKG is stable.  His weight is 209.  HEENT:  Ruddy complexion.  PERRLA.  Extraocular movements intact.  Sclerae  clear.  Facial symmetry is normal.  Dentition is normal.  Carotid upstrokes  are equal bilaterally without bruits.  No JVD.  Thyroid is not enlarged.  Trachea midline.  LUNGS:  Clear.  HEART:  Soft S1 and S2.  Sternotomy site is stable.  ABDOMEN:  Protuberant.  Good bowel sounds.  There is no midline bruit.  There is no hepatomegaly or hepatic tenderness.  EXTREMITIES:  With no cyanosis, clubbing, or edema.  Pulses are brisk.   ASSESSMENT AND PLAN:   Mr. Iodice is doing well.  He is due a stress Myoview,  which we will arrange off of atenolol.  We will also check a liver panel per  his request when he comes.  He has been advised to checked with Dr. Nobie Putnam  for a complete physical and lipids in the near future.   Plan on seeing him back in a year.       Thomas C. Daleen Squibb, MD, [facility]     TCW/MedQ  DD:  12/06/2005  DT:  12/08/2005  Job #:  [id number]   cc:   Patrica Duel, M.D.

## 2010-07-13 NOTE — Discharge Summary (Signed)
   NAME:  Chad Blair, Chad Blair NO.:  [id number]   MEDICAL RECORD NO.:  [id number]                   PATIENT TYPE:  INP   LOCATION:  A223                                 FACILITY:  [facility]   PHYSICIAN:  Corrie Mckusick, M.D.               DATE OF BIRTH:  [birth year]   DATE OF ADMISSION:  04/23/2002  DATE OF DISCHARGE:  04/24/2002                                 DISCHARGE SUMMARY   DISCHARGE DIAGNOSES:  Cholelithiasis.   HISTORY OF PRESENTING ILLNESS AND PAST MEDICAL HISTORY:  Please see  admission H&P.   HOSPITAL COURSE:  A 60 year old gentleman with known coronary disease status  post CABG in October 2001, hypertension, and hyperlipidemia who presented  with epigastric pain.  He was admitted for follow-up protocol despite having  more of a diffuse epigastric pain.  The lipase was normal on admission.  He  did have a normal white count on admission as well.  Thought to be early  cholecystitis.  CPK, troponin, and MBs were negative x3.  Ultrasound did  show gallstones with gallbladder wall thickening.  [facility] felt  that this was the cause of the pain.  Doubted to be cardiac etiology.  No  ECG changes.  He had a stress test in February 2003.  That stress test was  negative for ischemia and had normal LV function.  The third set of enzymes  was negative and they recommended no further cardiac evaluation.  The  patient wanted surgery performed at [facility] and will be set up as an  outpatient.  The patient was ready for discharge on April 24, 2002 with  no chest pains and felt quite well.  No change in medications.   DISCHARGE MEDICATIONS:  Same as admission.   DISCHARGE CONDITION:  Improved and stable.                                               Corrie Mckusick, M.D.    Flint Melter  D:  04/24/2002  T:  04/24/2002  Job:  [id number]

## 2010-07-13 NOTE — Consult Note (Signed)
NAME:  Chad Blair, Chad Blair NO.:  [id number]   MEDICAL RECORD NO.:  [id number]                   PATIENT TYPE:  INP   LOCATION:  A223                                 FACILITY:  [facility]   PHYSICIAN:  Vida Roller, M.D.                DATE OF BIRTH:  [birth year]   DATE OF CONSULTATION:  04/23/2002  DATE OF DISCHARGE:                                   CONSULTATION   REFERRING PHYSICIANS:  1. Dr. Dorthey Sawyer.  2. Dr. Patrica Duel.   HISTORY OF PRESENT ILLNESS:  The patient is a 60 year old white male with a  past medical history of coronary artery disease, status post bypass surgery  in 2001, who presents with epigastric discomfort and was found to have  cholecystitis with evidence of choledocholithiasis, and he is preoperative  for a laparoscopic cholecystectomy.  We are asked, I suspect, to do a  preoperative assessment.   PAST MEDICAL HISTORY:  1. Coronary artery disease.  He is status post a three-vessel coronary     bypass in 2001, after having a stent in his right coronary artery.  He     has a LIMA to his LAD, a saphenous vein graft to an obtuse marginal, and     a saphenous vein graft to an acute marginal.  He has normal left     ventricular function on a Cardiolite done in July of 2003, which showed     no ischemia and an EF of 65%.  2. He has dyslipidemia, on Zocor.  3. C-spine arthritis.  4. Hypertension.  5. Right wrist surgery.  6. Rectal fissure repair.   SOCIAL HISTORY:  He is married.  He has one child.  He does not smoke.  He  does not drink.  His mother is still alive at 25, but she does have coronary  disease.  His father passed away at age 20, and he had coronary disease as  well.   MEDICATIONS ON ADMISSION:  1. Mavik 2 mg a day.  2. Lipitor 10 mg a day.  3. Nexium 40 mg a day.  4. Aspirin 325 mg a day.   MEDICATIONS IN THE HOSPITAL:  Aspirin, Plavix, Zocor, and Mavik.   REVIEW OF SYSTEMS:  Noncontributory.   ALLERGIES:  He  is not allergic to any medications.   PHYSICAL EXAMINATION:  GENERAL:  Mildly obese white male in no acute  distress.  Alert and oriented x 4 and an excellent historian.  VITAL SIGNS:  His temperature is 97.7, pulse is 80 and regular, his  respiratory rate is 20, and he has a blood pressure of 127/80.  HEENT:  Unremarkable.  NECK:  Supple.  With no jugular venous distention or carotid bruits.  CHEST:  Clear to auscultation.  HEART:  Reveals a nondisplaced point of maximal impulse with no lifts or  thrills.  First and second heart sounds  are normal.  There is no third or  fourth heart sound.  No murmurs are noted.  His sternal wound is well healed  in the midline.  ABDOMEN:  Mildly tender along the upper right sternal border.  There is no  palpable gallbladder.  He has normoactive bowel sounds and no obvious  hepatosplenomegaly.  GENITOURINARY:  Deferred.  RECTAL:  Deferred.  EXTREMITIES:  There is no clubbing, cyanosis, or edema.  He has 2+ pulses  throughout.  MUSCULOSKELETAL:  Normal.  SKIN:  Without rashes.  NEUROLOGIC:  Nonfocal.   LABORATORY DATA:  Abdominal ultrasound shows distended gallbladder that is  thickened with pericholecystic edema consistent with acute cholecystitis.  There is a gallstone in the gallbladder and a question of a dilated bile  duct.   Electrocardiogram reveals sinus bradycardia at a rate of 55 with normal axis  and normal intervals.  He has poor R-wave progression but no acute ST and T-  wave changes, unchanged from June 2003.   White blood cell count of 8.6 with an H&H of 15 and 42, platelets are  257,000.  Sodium of 136, potassium of 4.3, chloride of 102, bicarbonate of  28, BUN of 14, creatinine of 1.1, and his blood glucose is 174.  Liver  function studies are unremarkable.  Amylase and lipase are normal.  Cardiac  enzymes are negative x 3 for acute myocardial infarction.   ASSESSMENT:  1. Epigastric chest discomfort which is likely secondary  to his     cholecystitis.  2. Preoperative assessment:  He is low risk for a low-risk procedure.     Patients who were recently revascularized in the last five years who have     no evidence of ongoing ischemia, as in this gentleman's case, are at     extremely low risk.  We would recommend the addition of a low dose of     atenolol to his medication profile, but otherwise he is very likely to do     very well with this surgery.                                               Vida Roller, M.D.    JH/MEDQ  D:  04/23/2002  T:  04/23/2002  Job:  [id number]

## 2010-07-13 NOTE — Procedures (Signed)
   NAME:  COLBI, STAUBS NO.:  [id number]   MEDICAL RECORD NO.:  [id number]                   PATIENT TYPE:   LOCATION:                                       FACILITY:   PHYSICIAN:  Edward L. Juanetta Gosling, M.D.             DATE OF BIRTH:  1950/10/10   DATE OF PROCEDURE:  DATE OF DISCHARGE:                                EKG INTERPRETATION   TIME AND DATE:  0402 on 04/23/2002   INTERPRETATION:  The rhythm is sinus rhythm with a rate in the 50s.  There  is bradycardia.  Slightly slow R wave progression across the precordium may  indicate a previous anteroseptal myocardial infarction and clinical  correlation is suggested.   IMPRESSION:  Abnormal electrocardiogram.                                               Edward L. Juanetta Gosling, M.D.    ELH/MEDQ  D:  04/28/2002  T:  04/29/2002  Job:  [id number]

## 2010-07-13 NOTE — Op Note (Signed)
[facility]  Patient:    Chad Blair, Chad Blair                        MRN: [id number] Proc. Date: 12/18/99 Adm. Date:  [id number] Disc. Date: [id number] Attending:  Charlett Lango CC:         Jesse Sans. Daleen Squibb, M.D. [facility]  Elpidio Eric, M.D.   Operative Report  PREOPERATIVE DIAGNOSIS:  Severe three-vessel coronary disease with class III angina.  POSTOPERATIVE DIAGNOSIS:  Severe three-vessel coronary disease with class III angina.  PROCEDURE:  Median sternotomy, extracorporeal circulation, coronary artery bypass grafting x r (left internal mammary artery to left anterior descending, saphenous vein graft to first obtuse marginal, sequential saphenous vein graft to acute marginal and posterior descending).  SURGEON:  Salvatore Decent. Dorris Fetch, M.D.  ASSISTANT:  Areta Haber, P.A.  ANESTHESIA:  General.  FINDINGS:  Good-quality targets, good-quality conduits.  Mild left ventricular hypertrophy.  CLINICAL NOTE:  Chad Blair is a 60 year old gentleman with a history of coronary disease dating back to 1994, when he underwent PTCA and stent of his right coronary artery.  The patient has been managed medically since that time and had done well.  He presented with recurrent class III angina.  Evaluation included cardiac catheterization, which revealed progression of his native coronary disease with severe three-vessel disease including proximal LAD complex stenosis.  The patient was referred for coronary artery bypass grafting.  The indications, risks, benefits, and alternatives were discussed in detail with the patient.  He understood the risks and benefits, accepted them, and agreed to proceed.  DESCRIPTION OF PROCEDURE:  Chad Blair was brought to the preoperative holding area on December 18, 1999.  Lines were placed to monitor arterial, central venous, and pulmonary arterial pressure.  EKG leads were placed for continuous telemetry.  The patient was  taken to the operating room and anesthetized and intubated.  A Foley catheter was placed, and intravenous antibiotics were administered.  The chest, abdomen, and legs were prepped and draped in the usual fashion.  A median sternotomy was performed, and the left internal mammary artery was harvested in the standard fashion.  Large branches were doubly clipped and divided.  Small branches were clipped on the mammary side and divided with cautery on the chest wall side.  The patient was fully heparinized prior to dividing the distal end of the mammary artery.  There was excellent flow through the cut end of the vessel.  The mammary was placed in a papaverine- soaked sponge and placed into the left pleural space.  Simultaneously incision was made in the medial aspect of the right leg, and the greater saphenous vein was harvested beginning at the ankle just above the knee.  The greater saphenous vein was of excellent quality, as was the mammary artery.  The pericardium was opened, and the ascending aorta was inspected.  It was normal size.  There was no palpable atherosclerotic disease.  The aorta was cannulated via concentric 2 Ethibond nonpledgeted pursestring sutures.  A dual-stage venous cannula was placed via pursestring suture in the right atrial appendage.  Cardiopulmonary bypass was instituted, and the patient was cooled to 32 degrees Celsius.  The coronary arteries were inspected and anastomotic sites were chosen.  The conduits were inspected and cut to length. A foam pad was placed in the pericardium to protect the left phrenic nerve. A temperature probe was placed in the myocardial septum, and a cardioplegia cannula was placed  in the ascending aorta.  The aorta was crossclamped.  The left ventricle was emptied the aortic root vent.  Cardiac arrest was achieved using a combination of cold antegrade blood cardioplegia and topical iced saline.  One thousand cubic centimeters  of cardioplegia was administered, and myocardial septal temperature was 11 degrees Celsius.  The following distal anastomoses were performed:  First, the reverse saphenous vein graft was placed sequentially to the acute marginal and posterior descending branches of the right coronary.  The acute marginal was a 1.5 mm good-quality target and had a tight stenosis at its origin.  The anastomosis was performed off a side branch of the vein graft in a side-to-side fashion using a running 7-0 Prolene suture.  There was excellent flow through the anastomosis.  An end-to-side anastomosis then was constructed using the distal end of the same segment of vein and anastomosing it end-to-side to the posterior descending branch of the right coronary.  The posterior descending was a 1.5 mm branch.  There was some mild atherosclerotic disease at the site of the anastomosis, but a 1.5 mm probe passed easily both proximally and distally to that.  At the completion of this anastomosis, there was excellent flow through the graft.  Cardioplegia was administered.  There was good hemostasis at both anastomoses.  Next, a reverse saphenous vein graft was placed end-to-side to the first obtuse marginal branch of the left circumflex coronary artery.  This vessel had a 50% stenosis at is origin, and it was a 1.7 mm good-quality target.  The vein graft was of good quality.  The anastomosis was performed end-to-side with a running 7-0 Prolene suture.  There was excellent flow through the anastomosis.  Cardioplegia was administered through the vein grafts.  There was good hemostasis.  Next, the left internal mammary artery was brought through a window in the pericardium.  The distal end was spatulated and was anastomosed end-to-side to the distal LAD using a running 8-0 Prolene suture.  The distal LAD was a 1.9 mm good-quality target.  The mammary was a 2 mm good-quality conduit.  The anastomosis was performed  end-to-side with a running 8-0 Prolene suture.  At  the completion of the anastomosis, the bulldog clamp was removed from the mammary artery, and immediate and rapid septal rewarming was noted.  Lidocaine was administered.  The mammary pedicle was tacked to the epicardial surface of the heart with 6-0 Prolene sutures.  The aortic cross clamp was removed. Total crosslcamp time was 47 minutes.  The patient fibrillated and required one defibrillation of 20 joules before resuming sinus rhythm.  A partial occlusion clamp was placed on the ascending aorta.  The vein grafts were cut to length.  The proximal vein graft anastomoses were performed through 4.0 mm punch aortotomies with running 6-0 Prolene sutures.  At the completion of the final proximal anastomosis, the patient was placed in Trendelenburg position.  The air was allowed to vent as the partial clamp was removed.  The suture then was tied.  Air was aspirated from the vein graft, the bulldog clamps were removed, and flow was restored.  All proximal and distal anastomoses were inspected for hemostasis.  Epicardial pacing wires were placed on the right ventricle and the right atrium.  The patient was being rewarmed during the proximal anastomoses and when core temperature reached 37 degrees Celsius, he weaned from cardiopulmonary bypass without difficulty.  The patient was in sinus rhythm and on no inotropic support at the time of separation  from bypass.  Initial cardiac index was greater than 3 L/min. per sq m.  The patient remained hemodynamically stable throughout the postbypass period.  A test dose of protamine was administered and was well-tolerated.  The atrial and aortic cannulae were removed.  There was good hemostasis at both cannulation sites.  The remainder of the protamine was administered without incident.  The chest was irrigated with 1 L of warm normal saline containing 1 g of vancomycin.  Hemostasis was achieved, and the  chest was irrigated with 1 L of normal saline containing 1 g of vancomycin.  A left pleural and two mediastinal chest tubes were placed through separate subcostal incisions.  The pericardium was loosely reapproximated with interrupted 3-0 silk sutures.  The sternum was closed with heavy-gauge interrupted stainless steel wires.  The pectoralis fascia was closed with a running #1 Vicryl suture.  Both the chest and the leg subcutaneous tissue was closed with running 2-0 Vicryl suture, and the skin was closed with 3-0 Vicryl subcuticular suture.  All sponge, needle, and instrument counts were correct at the end of the procedure.  The patient remained hemodynamically stable and was taken from the operating room to the surgical intensive care unit intubated and in a stable condition. DD:  12/18/99 TD:  12/19/99 Job: [id number]

## 2010-07-13 NOTE — Procedures (Signed)
[facility]  Patient:    Chad Blair, Chad Blair Visit Number: [id number] MRN: [id number]          Service Type: Attending:  Valera Castle, M.D. Dictated by:   Jacolyn Reedy, P.A.C. Proc. Date: 09/09/01                                Stress Test  INDICATIONS:  [name] is a 60 year old patient of Dr. Juanito Doom who has a history of coronary artery disease with multiple PTCAs and stents followed by CABG in 2001.  He recently had some epigastric burning and indigestion-like pain and is referred for stress testing.  TECHNIQUE:  Baseline EKG showed normal sinus rhythm with Q waves in lead III and poor R wave progression.  The patient exercised 9 minutes 35 seconds with Bruce protocol, obtaining a heart rate of 151.  Target heart rate was 143. Test was stopped due to fatigue.  He had no chest pain.  He did have some upsloping ST depression which resolved in recovery. Cardiolite images are to follow. Dictated by:   Jacolyn Reedy, P.A.C. Attending:  Valera Castle, M.D. DD:  09/09/01 TD:  09/12/01 Job: [id number]

## 2010-07-13 NOTE — H&P (Signed)
NAME:  SANTANA, EDELL NO.:  [id number]   MEDICAL RECORD NO.:  [id number]                   PATIENT TYPE:  EMS   LOCATION:  ED                                   FACILITY:  [facility]   PHYSICIAN:  Gracelyn Nurse, M.D.              DATE OF BIRTH:  October 22, 1950   DATE OF ADMISSION:  04/23/2002  DATE OF DISCHARGE:                                HISTORY & PHYSICAL   CHIEF COMPLAINT:  Epigastric pain and chest pain.   HISTORY OF PRESENT ILLNESS:  This 60 year old white male with history of  coronary artery disease, presents with epigastric and chest pain.  The pain  woke him from sleep and was not relieved by taking nitroglycerin at home.  The pain was relieved here in the ER with morphine.  He has had some nausea  but no vomiting.  The pain does not radiate.  He describes the pain as  epigastric and diffuse, from right side to left side and in the substernal  area.  He had the same pain yesterday, but not this intense.  He has had no  shortness of breath.   PAST MEDICAL HISTORY:  1. Coronary artery disease and a CABG in 10/01.  These stents placed in the     RCA.  2. Arthritis of the cervical spine.  3. Hypertension.  4. Hyperlipidemia.  5. Status post right wrist surgery.  6. Status post rectal fistula repair.   ALLERGIES:  No known drug allergies.   CURRENT MEDICATIONS:  Mavik 2 mg daily, Lipitor 2 mg daily, Nexium 40 mg  daily, enteric coated aspirin 325 mg daily, flax seed once a day, red rice  used daily.   SOCIAL HISTORY:  He does not smoke, does not drink alcohol.  He is married  with one child.   FAMILY HISTORY:  Father died at age 55 of coronary artery disease.  Mother  age 1, has coronary artery disease.   REVIEW OF SYSTEMS:  As per HPI.  Remainder of systems, negative.   PHYSICAL EXAMINATION:  VITAL SIGNS:  Temperature 100.1, pulse 60,  respirations 16, blood pressure 157/90.  GENERAL:  A well-nourished white male in no acute  distress.  HEENT:  Pupils equal, round, reactive to light.  Extraocular movements  intact.  Oral mucosa is moist.  Oropharynx is clear.  CARDIOVASCULAR:  Regular rate and rhythm with no murmurs.  LUNGS:  Clear to auscultation.  ABDOMEN:  Soft, nondistended, bowel sounds positive.  There is some mild  tenderness in the midepigastric area and right upper quadrant.  There is no  Murphy sign, but he has had 4 mg of morphine.  EXTREMITIES:  No edema.  NEUROLOGIC:  Cranial nerves II-XII grossly intact.  No focal deficit.  SKIN:  Moist with no rash.   LABORATORY/STUDIES:  EKG shows normal sinus rhythm, poor R-wave progression.   ADMITTING LABORATORY:  White  blood cells 8.6, hemoglobin 14.8, platelets  257.  Sodium 136, potassium 4.3, chloride 102, CO2 28, BUN 14, creatinine  1.1, glucose 174.  CK 157, MB fraction 2.0, troponin I is 0.01, amylase 54,  lipase 30.  Liver function tests within normal limits.   ASSESSMENT AND PLAN:  1. Abdominal pain.  He is having diffuse epigastric pain.  Lipase is normal,     so no pancreatitis.  He has a normal white count, but he does have a     fever.  Will have to consider in differential diagnosis, early     cholecystitis.  So am going to check an abdominal ultrasound to rule this     out.  2. Chest pain.  His epigastric pain does include the substernal area.  This     of course is atypical for cardiac pain, however, with his history of     coronary artery disease, we have to rule him out.  This does not sound     like unstable angina, so I am not going to anticoagulate him, but will go     ahead and rule him out and consult cardiology.  3. Hypertension.  Will continue him on Mavik.  4. Hyperglycemia.  He has no history of diabetes, will check a hemoglobin     A1C and monitor his blood sugar and will go ahead and start a sliding     scale.                                               Gracelyn Nurse, M.D.    JDJ/MEDQ  D:  04/23/2002  T:   04/23/2002  Job:  [id number]

## 2010-09-18 ENCOUNTER — Encounter (INDEPENDENT_AMBULATORY_CARE_PROVIDER_SITE_OTHER): Payer: Self-pay | Admitting: *Deleted

## 2010-09-18 MED ORDER — SODIUM CHLORIDE 0.45 % IV SOLN
Freq: Once | INTRAVENOUS | Status: AC
  Administered 2010-11-21: 07:00:00 via INTRAVENOUS

## 2010-09-19 ENCOUNTER — Encounter (INDEPENDENT_AMBULATORY_CARE_PROVIDER_SITE_OTHER): Payer: Self-pay | Admitting: Internal Medicine

## 2010-11-02 ENCOUNTER — Other Ambulatory Visit: Payer: Self-pay | Admitting: *Deleted

## 2010-11-02 MED ORDER — FENOFIBRATE 145 MG PO TABS: 145.0000 mg | ORAL_TABLET | Freq: Every day | ORAL | Status: DC

## 2010-11-02 MED ORDER — ATENOLOL 50 MG PO TABS: 50.0000 mg | ORAL_TABLET | Freq: Every day | ORAL | Status: DC

## 2010-11-02 MED ORDER — HYDROCHLOROTHIAZIDE 12.5 MG PO CAPS: 12.5000 mg | ORAL_CAPSULE | Freq: Every day | ORAL | Status: DC

## 2010-11-02 MED ORDER — TRANDOLAPRIL 4 MG PO TABS: 4.0000 mg | ORAL_TABLET | Freq: Every day | ORAL | Status: DC

## 2010-11-21 ENCOUNTER — Other Ambulatory Visit (INDEPENDENT_AMBULATORY_CARE_PROVIDER_SITE_OTHER): Payer: Self-pay | Admitting: Internal Medicine

## 2010-11-21 ENCOUNTER — Encounter (HOSPITAL_COMMUNITY)
Admission: RE | Disposition: A | Discharge status: Home / Self Care | Payer: Self-pay | Source: Ambulatory Visit | Attending: Internal Medicine

## 2010-11-21 ENCOUNTER — Ambulatory Visit (HOSPITAL_COMMUNITY)
Admission: RE | Admit: 2010-11-21 | Discharge: 2010-11-21 | Disposition: A | Discharge status: Home / Self Care | Payer: 59 | Source: Ambulatory Visit | Attending: Internal Medicine | Admitting: Internal Medicine

## 2010-11-21 DIAGNOSIS — D126 Benign neoplasm of colon, unspecified: Secondary | ICD-10-CM | POA: Insufficient documentation

## 2010-11-21 DIAGNOSIS — K648 Other hemorrhoids: Secondary | ICD-10-CM | POA: Insufficient documentation

## 2010-11-21 DIAGNOSIS — Z7982 Long term (current) use of aspirin: Secondary | ICD-10-CM | POA: Insufficient documentation

## 2010-11-21 DIAGNOSIS — Z1211 Encounter for screening for malignant neoplasm of colon: Secondary | ICD-10-CM

## 2010-11-21 HISTORY — PX: COLONOSCOPY: SHX5424

## 2010-11-21 SURGERY — COLONOSCOPY
Anesthesia: Moderate Sedation

## 2010-11-21 MED ORDER — MIDAZOLAM HCL 5 MG/5ML IJ SOLN
INTRAMUSCULAR | Status: AC
  Filled 2010-11-21: qty 10

## 2010-11-21 MED ORDER — MIDAZOLAM HCL 5 MG/5ML IJ SOLN
INTRAMUSCULAR | Status: DC | PRN
  Administered 2010-11-21 (×2): 2 mg via INTRAVENOUS
  Administered 2010-11-21: 1 mg via INTRAVENOUS

## 2010-11-21 MED ORDER — MEPERIDINE HCL 50 MG/ML IJ SOLN
INTRAMUSCULAR | Status: AC
  Filled 2010-11-21: qty 1

## 2010-11-21 MED ORDER — STERILE WATER FOR IRRIGATION IR SOLN
Status: DC | PRN
  Administered 2010-11-21: 08:00:00

## 2010-11-21 MED ORDER — MEPERIDINE HCL 50 MG/ML IJ SOLN
INTRAMUSCULAR | Status: DC | PRN
  Administered 2010-11-21 (×2): 25 mg via INTRAVENOUS

## 2010-11-21 NOTE — Op Note (Signed)
COLONOSCOPY PROCEDURE REPORT  PATIENT:  Chad Blair  MR#:  [id number] Birthdate:  05-23-1950, 60 y.o., male Endoscopist:  Dr. Malissa Hippo, MD Referred By:  Dr. Colette Ribas, MD  Procedure Date: 11/21/2010  Procedure:   Colonoscopy with snare polypectomy  Indications:  Average risk screening colonoscopy.  Informed Consent: Procedure and risks were reviewed with the patient and informed consent was obtained. Medications:  Demerol 50 mg IV Versed of mg IV  Description of procedure:  After a digital rectal exam was performed, that colonoscope was advanced from the anus through the rectum and colon to the area of the cecum, ileocecal valve and appendiceal orifice. The cecum was deeply intubated. These structures were well-seen and photographed for the record. From the level of the cecum and ileocecal valve, the scope was slowly and cautiously withdrawn. The mucosal surfaces were carefully surveyed utilizing scope tip to flexion to facilitate fold flattening as needed. The scope was pulled down into the rectum where a thorough exam including retroflexion was performed. Short segment of TI was also examined  Findings:   Prep excellent. Normal terminal ileum. 6 mm polyp and a short stalk snared from distal sigmoid colon. Small hemorrhoids below the dentate line.  Therapeutic/Diagnostic Maneuvers Performed:   see above  Complications:  None  Cecal Withdrawal Time:  16 minutes  Impression:  Normal terminal ileum. 6 mm polyp snared from distal sigmoid colon. Small external hemorrhoids.  Recommendations:  Standard instructions given. Resume aspirin on 11/26/2010. I will be contacting patient with biopsy results.  Shanya Ferriss U  11/21/2010 8:10 AM  CC: Dr. No primary provider on file. & Dr. No ref. provider found

## 2010-11-21 NOTE — H&P (Signed)
Chad Gain Sr. is an 59 y.o. male.   Chief Complaint: Patient is here for colonoscopy. HPI: Patient is 60 year old Caucasian male who is here for average risk screening colonoscopy. Patient's last exam was 10 years ago. He denies rectal bleeding abdominal pain or constipation. He has intermittent loose stool felt to be secondary to metformin. Family history is negative for colorectal carcinoma.  No past medical history on file.  No past surgical history on file.  No family history on file. Social History:  does not have a smoking history on file. He does not have any smokeless tobacco history on file. His alcohol and drug histories not on file.  Allergies: No Known Allergies  Medications Prior to Admission  Medication Dose Route Frequency Provider Last Rate Last Dose  . 0.45 % sodium chloride infusion   Intravenous Once Malissa Hippo, MD 20 mL/hr at 11/21/10 0700    . meperidine (DEMEROL) 50 MG/ML injection           . midazolam (VERSED) 5 MG/5ML injection            Medications Prior to Admission  Medication Sig Dispense Refill  . aspirin 325 MG EC tablet Take 325 mg by mouth daily.        Marland Kitchen esomeprazole (NEXIUM) 40 MG capsule Take 40 mg by mouth daily before breakfast.        . ezetimibe-simvastatin (VYTORIN) 10-20 MG per tablet Take 1 tablet by mouth at bedtime.        . Glucosamine Sulfate 1000 MG CAPS Take 1,000 mg by mouth 2 (two) times daily.        . Multiple Vitamins-Minerals (MULTI FOR HIM PO) Take 1 tablet by mouth daily.        . nabumetone (RELAFEN) 750 MG tablet Take 750 mg by mouth 2 (two) times daily as needed. For arthritis       . niacin (NIASPAN) 500 MG CR tablet Take 500 mg by mouth at bedtime.        . sitaGLIPtan-metformin (JANUMET) 50-1000 MG per tablet Take 1 tablet by mouth 2 (two) times daily with a meal.          No results found for this or any previous visit (from the past 48 hour(s)). No results found.  Review of Systems  Constitutional: Negative  for weight loss.  Gastrointestinal: Negative for abdominal pain, constipation, blood in stool and melena. Diarrhea: intermittent loose stool felt to be secondary to metformin.    Blood pressure 115/74, pulse 68, temperature 98.8 F (37.1 C), temperature source Oral, resp. rate 18, SpO2 98.00%. Physical Exam  Constitutional: He appears well-developed and well-nourished.  HENT:  Mouth/Throat: Oropharynx is clear and moist.  Eyes: Conjunctivae are normal. No scleral icterus.  Neck: No thyromegaly present.  Cardiovascular: Normal rate, regular rhythm and normal heart sounds.   No murmur heard. Respiratory: Effort normal and breath sounds normal.  GI: Soft. He exhibits no distension and no mass. There is no tenderness.  Musculoskeletal: He exhibits no edema.  Lymphadenopathy:    He has no cervical adenopathy.  Neurological: He is alert.  Skin: Skin is warm.     Assessment/Plan Average risk screening colonoscopy.  Caius Silbernagel U 11/21/2010, 7:38 AM

## 2010-11-23 ENCOUNTER — Encounter (INDEPENDENT_AMBULATORY_CARE_PROVIDER_SITE_OTHER): Payer: Self-pay | Admitting: *Deleted

## 2010-11-26 ENCOUNTER — Encounter: Payer: Self-pay | Admitting: Cardiology

## 2010-11-26 LAB — POCT I-STAT GLUCOSE
Glucose, Bld: 122 — ABNORMAL HIGH
Operator id: 141321

## 2010-11-27 ENCOUNTER — Encounter: Payer: Self-pay | Admitting: Cardiology

## 2010-11-27 ENCOUNTER — Ambulatory Visit (INDEPENDENT_AMBULATORY_CARE_PROVIDER_SITE_OTHER): Payer: 59 | Admitting: Cardiology

## 2010-11-27 VITALS — BP 122/70 | HR 71 | Ht 68.0 in | Wt 181.0 lb

## 2010-11-27 DIAGNOSIS — E785 Hyperlipidemia, unspecified: Secondary | ICD-10-CM

## 2010-11-27 DIAGNOSIS — E119 Type 2 diabetes mellitus without complications: Secondary | ICD-10-CM

## 2010-11-27 DIAGNOSIS — I2510 Atherosclerotic heart disease of native coronary artery without angina pectoris: Secondary | ICD-10-CM

## 2010-11-27 DIAGNOSIS — I10 Essential (primary) hypertension: Secondary | ICD-10-CM

## 2010-11-27 NOTE — Patient Instructions (Signed)
Your physician wants you to follow-up in: 1 year with Dr. Wall. You will receive a reminder letter in the mail two months in advance. If you don't receive a letter, please call our office to schedule the follow-up appointment.  

## 2010-11-27 NOTE — Assessment & Plan Note (Signed)
Stable. Remarkably well with diet, weight loss, and compliance with meds. No change in treatment. Return in one year.

## 2010-11-27 NOTE — Progress Notes (Signed)
HPI [name] returns today for evaluation and management of his coronary disease. He is now 12 years out from bypass surgery. He is having no symptoms of angina or ischemia. He is still working full-time and continued on fish a lot.  Compliant with his medications. He has lost 22 pounds and his last hemoglobin A1c was 6.4. His laboratory data is being followed by Dr. Phillips Odor.  EKG shows normal sinus rhythm with poor R-wave  progression, no change. Past Medical History  Diagnosis Date  . Coronary artery disease   . Hyperlipidemia   . GERD (gastroesophageal reflux disease)   . PUD (peptic ulcer disease)   . Hiatal hernia   . Diabetes mellitus     Past Surgical History  Procedure Date  . Coronary artery bypass graft     x4  . Cholecystectomy   . Wrist surgery     right    Family History  Problem Relation Age of Onset  . Coronary artery disease Father 81    died  . Coronary artery disease Mother 48    History   Social History  . Marital Status: Married    Spouse Name: N/A    Number of Children: 1  . Years of Education: N/A   Occupational History  .  [employer]   Social History Main Topics  . Smoking status: Never Smoker   . Smokeless tobacco: Not on file  . Alcohol Use: No  . Drug Use: No  . Sexually Active: Not on file   Other Topics Concern  . Not on file   Social History Narrative  . No narrative on file    No Known Allergies  Current Outpatient Prescriptions  Medication Sig Dispense Refill  . aspirin 325 MG tablet Take 325 mg by mouth daily.        Marland Kitchen atenolol (TENORMIN) 50 MG tablet Take 1 tablet (50 mg total) by mouth daily.  90 tablet  0  . esomeprazole (NEXIUM) 40 MG capsule Take 40 mg by mouth daily before breakfast.        . ezetimibe-simvastatin (VYTORIN) 10-20 MG per tablet Take 1 tablet by mouth at bedtime.        . fenofibrate (TRICOR) 145 MG tablet Take 1 tablet (145 mg total) by mouth daily.  90 tablet  0  . Glucosamine Sulfate 1000  MG CAPS Take 1,000 mg by mouth 2 (two) times daily.        . hydrochlorothiazide (,MICROZIDE/HYDRODIURIL,) 12.5 MG capsule Take 1 capsule (12.5 mg total) by mouth daily.  90 capsule  0  . Multiple Vitamins-Minerals (MULTI FOR HIM PO) Take 1 tablet by mouth daily.        . nabumetone (RELAFEN) 750 MG tablet Take 750 mg by mouth 2 (two) times daily as needed. For arthritis       . niacin (NIASPAN) 500 MG CR tablet Take 500 mg by mouth at bedtime.        . sitaGLIPtan-metformin (JANUMET) 50-1000 MG per tablet Take 1 tablet by mouth 2 (two) times daily with a meal.        . trandolapril (MAVIK) 4 MG tablet Take 1 tablet (4 mg total) by mouth daily.  90 tablet  0    ROS Negative other than HPI.   PE General Appearance: well developed, well nourished in no acute distress HEENT: symmetrical face, PERRLA, good dentition  Neck: no JVD, thyromegaly, or adenopathy, trachea midline Chest: symmetric without deformity Cardiac: PMI non-displaced, RRR,  normal S1, S2, no gallop or murmur Lung: clear to ausculation and percussion Vascular: all pulses full without bruits  Abdominal: nondistended, nontender, good bowel sounds, no HSM, no bruits Extremities: no cyanosis, clubbing or edema, no sign of DVT, no varicosities  Skin: normal color, no rashes Neuro: alert and oriented x 3, non-focal Pysch: normal affect Filed Vitals:   11/27/10 1614  BP: 122/70  Pulse: 71  Height: 5\' 8"  (1.727 m)  Weight: 181 lb (82.101 kg)    EKG  Labs and Studies Reviewed.   Lab Results  Component Value Date   WBC 6.8 12/15/2007   HGB 15.1 12/15/2007   HCT 43.5 12/15/2007   MCV 87.3 12/15/2007   PLT 266 12/15/2007      Chemistry      Component Value Date/Time   NA 139 12/11/2009 0923   K 4.4 12/11/2009 0923   CL 103 12/11/2009 0923   CO2 28 12/11/2009 0923   BUN 22 12/11/2009 0923   CREATININE 1.1 12/11/2009 0923      Component Value Date/Time   CALCIUM 10.0 12/11/2009 0923   ALKPHOS 57 12/11/2009 0923    AST 30 12/11/2009 0923   ALT 44 12/11/2009 0923   BILITOT 0.7 12/11/2009 0923       Lab Results  Component Value Date   CHOL 141 12/11/2009   Lab Results  Component Value Date   HDL 36.90* 12/11/2009   Lab Results  Component Value Date   LDLCALC 78 12/11/2009   Lab Results  Component Value Date   TRIG 133.0 12/11/2009   Lab Results  Component Value Date   CHOLHDL 4 12/11/2009   Lab Results  Component Value Date   HGBA1C 7.5* 12/11/2009   Lab Results  Component Value Date   ALT 44 12/11/2009   AST 30 12/11/2009   ALKPHOS 57 12/11/2009   BILITOT 0.7 12/11/2009   No results found for this basename: TSH

## 2010-11-27 NOTE — Assessment & Plan Note (Signed)
A1c is 6.4, excellent control. Weight loss has obviously helped.

## 2010-11-28 ENCOUNTER — Encounter (HOSPITAL_COMMUNITY): Payer: Self-pay | Admitting: Internal Medicine

## 2010-11-30 NOTE — Progress Notes (Signed)
Addended by: Micki Riley C on: 11/30/2010 09:46 AM   Modules accepted: Orders

## 2010-12-10 ENCOUNTER — Other Ambulatory Visit: Payer: Self-pay | Admitting: Cardiology

## 2011-01-28 ENCOUNTER — Telehealth: Payer: Self-pay

## 2011-01-28 MED ORDER — ATENOLOL 50 MG PO TABS: 50.0000 mg | ORAL_TABLET | Freq: Every day | ORAL | Status: DC

## 2011-01-28 NOTE — Telephone Encounter (Signed)
Refill sent for atenolol.  

## 2011-01-31 ENCOUNTER — Other Ambulatory Visit: Payer: Self-pay | Admitting: *Deleted

## 2011-01-31 MED ORDER — HYDROCHLOROTHIAZIDE 12.5 MG PO CAPS: 12.5000 mg | ORAL_CAPSULE | Freq: Every day | ORAL | Status: DC

## 2011-02-11 ENCOUNTER — Other Ambulatory Visit: Payer: Self-pay | Admitting: Cardiology

## 2011-02-11 MED ORDER — TRANDOLAPRIL 4 MG PO TABS: 4.0000 mg | ORAL_TABLET | Freq: Every day | ORAL | Status: DC

## 2011-02-11 MED ORDER — ATENOLOL 50 MG PO TABS: 50.0000 mg | ORAL_TABLET | Freq: Every day | ORAL | Status: DC

## 2011-02-11 MED ORDER — FENOFIBRATE 145 MG PO TABS: 145.0000 mg | ORAL_TABLET | Freq: Every day | ORAL | Status: DC

## 2011-07-02 ENCOUNTER — Encounter: Payer: Self-pay | Admitting: Cardiology

## 2011-12-25 ENCOUNTER — Encounter: Payer: Self-pay | Admitting: Cardiology

## 2011-12-25 ENCOUNTER — Ambulatory Visit (INDEPENDENT_AMBULATORY_CARE_PROVIDER_SITE_OTHER): Payer: 59 | Admitting: Cardiology

## 2011-12-25 VITALS — BP 128/77 | HR 76 | Ht 68.0 in | Wt 186.0 lb

## 2011-12-25 DIAGNOSIS — E119 Type 2 diabetes mellitus without complications: Secondary | ICD-10-CM

## 2011-12-25 DIAGNOSIS — E785 Hyperlipidemia, unspecified: Secondary | ICD-10-CM

## 2011-12-25 DIAGNOSIS — I10 Essential (primary) hypertension: Secondary | ICD-10-CM

## 2011-12-25 DIAGNOSIS — I25810 Atherosclerosis of coronary artery bypass graft(s) without angina pectoris: Secondary | ICD-10-CM

## 2011-12-25 DIAGNOSIS — I2510 Atherosclerotic heart disease of native coronary artery without angina pectoris: Secondary | ICD-10-CM

## 2011-12-25 MED ORDER — NIACIN ER (ANTIHYPERLIPIDEMIC) 500 MG PO TBCR: 500.0000 mg | EXTENDED_RELEASE_TABLET | Freq: Every day | ORAL | Status: DC

## 2011-12-25 MED ORDER — ATENOLOL 50 MG PO TABS: 50.0000 mg | ORAL_TABLET | Freq: Every day | ORAL | Status: DC

## 2011-12-25 MED ORDER — HYDROCHLOROTHIAZIDE 12.5 MG PO CAPS: 12.5000 mg | ORAL_CAPSULE | Freq: Every day | ORAL | Status: DC

## 2011-12-25 MED ORDER — TRANDOLAPRIL 4 MG PO TABS: 4.0000 mg | ORAL_TABLET | Freq: Every day | ORAL | Status: DC

## 2011-12-25 MED ORDER — EZETIMIBE-SIMVASTATIN 10-20 MG PO TABS: 1.0000 | ORAL_TABLET | Freq: Every day | ORAL | Status: DC

## 2011-12-25 NOTE — Assessment & Plan Note (Signed)
Stable. Continue secondary preventative therapy. Push hard to lower blood sugar and A1c. No changes in his cardiovascular program.

## 2011-12-25 NOTE — Patient Instructions (Signed)
Your physician recommends that you continue on your current medications as directed. Please refer to the Current Medication list given to you today.   Your physician wants you to follow-up in: 1 year with Dr. Wall. You will receive a reminder letter in the mail two months in advance. If you don't receive a letter, please call our office to schedule the follow-up appointment.  

## 2011-12-25 NOTE — Assessment & Plan Note (Signed)
I spent over 25 minutes talking about management of his diabetes as well as his hyperlipidemia. He is very concerned about complexity of his medical therapy. He also wanted to know if he should go on insulin.  I  advised to stay on his current program. His liver needs to be monitored closely with him on simvastatin, a fibrate  and Niaspan. He is encouraged to lose additional weight, stay active, and wishes A1c as little as possible. I've advised him not to go on insulin for fear of gaining weight. I'll see him back in a year or when necessary.

## 2011-12-25 NOTE — Progress Notes (Signed)
HPI Chad Blair returns today for evaluation and management of his history of coronary artery disease, history of coronary artery bypass grafting 12 years ago, mixed hyperlipidemia.  He is having no symptoms of angina or ischemic heart disease. He is very active working and hunting. He is on triple drug therapy for his lipids and is also on complex oral therapy for his diabetes. This is being monitored by Dr. Lurene Shadow. His hemoglobin A1c has dropped from 7.6-6.8. He has lost significant weight. His last lipid panel showed a total cholesterol of 135, triglycerides 119, HDL 38 is pretty good for him, VLDL 24 and LDL of 73.  Past Medical History  Diagnosis Date  . Coronary artery disease   . Hyperlipidemia   . GERD (gastroesophageal reflux disease)   . PUD (peptic ulcer disease)   . Hiatal hernia   . Diabetes mellitus     Current Outpatient Prescriptions  Medication Sig Dispense Refill  . aspirin 325 MG tablet Take 325 mg by mouth daily.        Marland Kitchen atenolol (TENORMIN) 50 MG tablet Take 1 tablet (50 mg total) by mouth daily.  90 tablet  3  . esomeprazole (NEXIUM) 40 MG capsule Take 40 mg by mouth daily before breakfast.        . ezetimibe-simvastatin (VYTORIN) 10-20 MG per tablet Take 1 tablet by mouth at bedtime.        . fenofibrate (TRICOR) 145 MG tablet Take 1 tablet (145 mg total) by mouth daily.  90 tablet  3  . Glucosamine Sulfate 1000 MG CAPS Take 1,000 mg by mouth 2 (two) times daily.        . hydrochlorothiazide (MICROZIDE) 12.5 MG capsule Take 1 capsule (12.5 mg total) by mouth daily.  90 capsule  3  . Multiple Vitamins-Minerals (MULTI FOR HIM PO) Take 1 tablet by mouth daily.        . nabumetone (RELAFEN) 750 MG tablet Take 750 mg by mouth 2 (two) times daily as needed. For arthritis       . niacin (NIASPAN) 500 MG CR tablet Take 500 mg by mouth at bedtime.        . Probiotic Product (ALIGN PO) Take by mouth. One tab a day      . sitaGLIPtan-metformin (JANUMET) 50-1000 MG per tablet Take  1 tablet by mouth 2 (two) times daily with a meal.        . trandolapril (MAVIK) 4 MG tablet Take 1 tablet (4 mg total) by mouth daily.  90 tablet  3  . ValACYclovir HCl (VALTREX PO) Take by mouth. 1 gm three times a day        No Known Allergies  Family History  Problem Relation Age of Onset  . Coronary artery disease Father 34    died  . Coronary artery disease Mother 7    History   Social History  . Marital Status: Married    Spouse Name: N/A    Number of Children: 1  . Years of Education: N/A   Occupational History  .  [employer]   Social History Main Topics  . Smoking status: Never Smoker   . Smokeless tobacco: Not on file  . Alcohol Use: No  . Drug Use: No  . Sexually Active: Not on file   Other Topics Concern  . Not on file   Social History Narrative  . No narrative on file    ROS ALL NEGATIVE EXCEPT THOSE NOTED IN HPI  PE  General Appearance: well developed, well nourished in no acute distress HEENT: symmetrical face, PERRLA, good dentition  Neck: no JVD, thyromegaly, or adenopathy, trachea midline Chest: symmetric without deformity Cardiac: PMI non-displaced, RRR, normal S1, S2, no gallop or murmur Lung: clear to ausculation and percussion Vascular: all pulses full without bruits  Abdominal: nondistended, nontender, good bowel sounds, no HSM, no bruits Extremities: no cyanosis, clubbing or edema, no sign of DVT, no varicosities  Skin: normal color, no rashes Neuro: alert and oriented x 3, non-focal Pysch: normal affect  EKG Normal sinus rhythm, poor R wave progression, no change. BMET    Component Value Date/Time   NA 139 12/11/2009 0923   K 4.4 12/11/2009 0923   CL 103 12/11/2009 0923   CO2 28 12/11/2009 0923   GLUCOSE 148* 12/11/2009 0923   BUN 22 12/11/2009 0923   CREATININE 1.1 12/11/2009 0923   CALCIUM 10.0 12/11/2009 0923   GFRNONAA 74.30 12/11/2009 0923   GFRAA 80 12/15/2007 1437    Lipid Panel     Component Value  Date/Time   CHOL 141 12/11/2009 0923   TRIG 133.0 12/11/2009 0923   HDL 36.90* 12/11/2009 0923   CHOLHDL 4 12/11/2009 0923   VLDL 26.6 12/11/2009 0923   LDLCALC 78 12/11/2009 0923    CBC    Component Value Date/Time   WBC 6.8 12/15/2007 1437   RBC 4.98 12/15/2007 1437   HGB 15.1 12/15/2007 1437   HCT 43.5 12/15/2007 1437   PLT 266 12/15/2007 1437   MCV 87.3 12/15/2007 1437   MCHC 34.8 12/15/2007 1437   RDW 13.0 12/15/2007 1437   MONOABS 0.7 12/15/2007 1437   EOSABS 0.1 12/15/2007 1437   BASOSABS 0.0 12/15/2007 1437

## 2011-12-31 ENCOUNTER — Other Ambulatory Visit: Payer: Self-pay | Admitting: Cardiology

## 2012-12-05 ENCOUNTER — Other Ambulatory Visit: Payer: Self-pay | Admitting: Cardiology

## 2012-12-30 ENCOUNTER — Encounter: Payer: Self-pay | Admitting: Cardiology

## 2012-12-30 ENCOUNTER — Ambulatory Visit (INDEPENDENT_AMBULATORY_CARE_PROVIDER_SITE_OTHER): Payer: 59 | Admitting: Cardiology

## 2012-12-30 VITALS — BP 141/89 | HR 78 | Ht 68.0 in | Wt 192.0 lb

## 2012-12-30 DIAGNOSIS — E782 Mixed hyperlipidemia: Secondary | ICD-10-CM

## 2012-12-30 DIAGNOSIS — I2510 Atherosclerotic heart disease of native coronary artery without angina pectoris: Secondary | ICD-10-CM

## 2012-12-30 DIAGNOSIS — I10 Essential (primary) hypertension: Secondary | ICD-10-CM

## 2012-12-30 MED ORDER — TRANDOLAPRIL 4 MG PO TABS: 4.0000 mg | ORAL_TABLET | Freq: Every day | ORAL | Status: DC

## 2012-12-30 MED ORDER — NIACIN ER (ANTIHYPERLIPIDEMIC) 500 MG PO TBCR: 500.0000 mg | EXTENDED_RELEASE_TABLET | Freq: Every day | ORAL | Status: DC

## 2012-12-30 MED ORDER — FENOFIBRATE 145 MG PO TABS: 145.0000 mg | ORAL_TABLET | Freq: Every day | ORAL | Status: DC

## 2012-12-30 MED ORDER — HYDROCHLOROTHIAZIDE 12.5 MG PO CAPS: 12.5000 mg | ORAL_CAPSULE | Freq: Every day | ORAL | Status: DC

## 2012-12-30 MED ORDER — ATORVASTATIN CALCIUM 40 MG PO TABS: 40.0000 mg | ORAL_TABLET | Freq: Every day | ORAL | Status: DC

## 2012-12-30 NOTE — Patient Instructions (Addendum)
Your physician recommends that you schedule a follow-up appointment in: 8 WEEKS  Your physician recommends that you return for lab work in: 7 WEEKS Your physician recommends that you have follow up lab work, we will mail you a reminder letter to alert you when to go [employer], located across the street from our office. FOR CMET,FLP YOU WILL NEED TO BE FASTING  Your physician has recommended you make the following change in your medication:   1) STOP TAKING VYTORIN 2) START TAKING LIPITOR 40MG  ONCE DAILY 3) DECREASE ASPIRIN TO 81MG  ONCE DAILY

## 2012-12-30 NOTE — Progress Notes (Signed)
Clinical Summary Chad Blair is a 62 y.o.male former patient of Dr Chad Blair, I am seeing the patient for the first time today for the following medical problems  1. CAD - prior CABG 12 years ago at [facility]  - denies any recent chest pain. No SOB or DOE. Uses an exercise bike 30 min every other day without troubles. No orthopnea, no PND,no LE edema - compliant with meds: ASA 325, atenolol, zetia/simva, trandolapril   2. Hyperlipidemia - followed by Dr Chad Blair in [place], his pcp - compliant with regimen: zetia, simva, fenofirbate, niacin. - no recent panel in our system   3. HTN - does not check at home - compliant with medications, including ACE-I in setting of diabetes mellitus.   Past Medical History  Diagnosis Date  . Coronary artery disease   . Hyperlipidemia   . GERD (gastroesophageal reflux disease)   . PUD (peptic ulcer disease)   . Hiatal hernia   . Diabetes mellitus      No Known Allergies   Current Outpatient Prescriptions  Medication Sig Dispense Refill  . aspirin 325 MG tablet Take 325 mg by mouth daily.        Marland Kitchen atenolol (TENORMIN) 50 MG tablet Take 1 tablet (50 mg total) by mouth daily.  90 tablet  3  . esomeprazole (NEXIUM) 40 MG capsule Take 40 mg by mouth daily before breakfast.        . ezetimibe-simvastatin (VYTORIN) 10-20 MG per tablet Take 1 tablet by mouth at bedtime.  90 tablet  3  . fenofibrate (TRICOR) 145 MG tablet TAKE 1 TABLET DAILY  90 tablet  3  . Glucosamine Sulfate 1000 MG CAPS Take 1,000 mg by mouth 2 (two) times daily.        . hydrochlorothiazide (MICROZIDE) 12.5 MG capsule TAKE 1 CAPSULE DAILY  90 capsule  0  . Multiple Vitamins-Minerals (MULTI FOR HIM PO) Take 1 tablet by mouth daily.        . nabumetone (RELAFEN) 750 MG tablet Take 750 mg by mouth 2 (two) times daily as needed. For arthritis       . niacin (NIASPAN) 500 MG CR tablet Take 1 tablet (500 mg total) by mouth at bedtime.  90 tablet  3  . Probiotic Product (ALIGN  PO) Take by mouth. One tab a day      . sitaGLIPtan-metformin (JANUMET) 50-1000 MG per tablet Take 1 tablet by mouth 2 (two) times daily with a meal.        . trandolapril (MAVIK) 4 MG tablet Take 1 tablet (4 mg total) by mouth daily.  90 tablet  3  . ValACYclovir HCl (VALTREX PO) Take by mouth. 1 gm three times a day       No current facility-administered medications for this visit.     Past Surgical History  Procedure Laterality Date  . Coronary artery bypass graft      x4  . Cholecystectomy    . Wrist surgery      right  . Colonoscopy  11/21/2010    Procedure: COLONOSCOPY;  Surgeon: Chad Hippo, MD;  [facility]: [facility];  Service: Endoscopy;  Laterality: N/A;     No Known Allergies    Family History  Problem Relation Age of Onset  . Coronary artery disease Father 28    died  . Coronary artery disease Mother 58     Social History Chad Blair reports that he has never smoked. He does  not have any smokeless tobacco history on file. Chad Blair reports that he does not drink alcohol.   Review of Systems CONSTITUTIONAL: No weight loss, fever, chills, weakness or fatigue.  HEENT: Eyes: No visual loss, blurred vision, double vision or yellow sclerae.No hearing loss, sneezing, congestion, runny nose or sore throat.  SKIN: No rash or itching.  CARDIOVASCULAR: per HPI RESPIRATORY: per HPI  GASTROINTESTINAL: No anorexia, nausea, vomiting or diarrhea. No abdominal pain or blood.  GENITOURINARY: No burning on urination, no polyuria NEUROLOGICAL: No headache, dizziness, syncope, paralysis, ataxia, numbness or tingling in the extremities. No change in bowel or bladder control.  MUSCULOSKELETAL: No muscle, back pain, joint pain or stiffness.  LYMPHATICS: No enlarged nodes. No history of splenectomy.  PSYCHIATRIC: No history of depression or anxiety.  ENDOCRINOLOGIC: No reports of sweating, cold or heat intolerance. No polyuria or polydipsia.  Marland Kitchen   Physical Examination p 78  bp 130/70 Wt 192 lbs BMI 29 Gen: resting comfortably, no acute distress HEENT: no scleral icterus, pupils equal round and reactive, no palptable cervical adenopathy,  CV: RRR, no m/r/g,no JVD, no carotid bruits Resp: Clear to auscultation bilaterally GI: abdomen is soft, non-tender, non-distended, normal bowel sounds, no hepatosplenomegaly MSK: extremities are warm, no edema.  Skin: warm, no rash Neuro:  no focal deficits Psych: appropriate affect   Diagnostic Studies 12/30/12 Clinic EKG: sinus, normal axis, normal interval, LAE, poor R wave progression    Assessment and Plan  1. CAD - no current symptoms - continue risk factor modification and secondary prevention  2. HTN - patient is at goal (<130/80) given his diabetes, he is on an ACE-I - continue current meds  3. Hyperlipidemia - patient currently on zetia/simva (vytorin), fenofibrate, and niacin - recommend stopping vytorin. Based on newest lipid guidelines given the patient's history of DM he would be recommended to be on at least lipitor 40mg  daily, and potentially 80mg  daily given his history of CAD. He reports some mild myalgias on lipitor before, will start at 40mg  and check lipids and LFTs in 8 weeks. Will consider stopping niacin as well in the near future pending lab results due to the lack of clinical benefit from trial data.  F/u 2 months      Chad Blair, M.D., F.A.C.C.

## 2013-01-11 ENCOUNTER — Other Ambulatory Visit: Payer: Self-pay | Admitting: *Deleted

## 2013-01-11 ENCOUNTER — Encounter: Payer: Self-pay | Admitting: *Deleted

## 2013-01-11 DIAGNOSIS — E782 Mixed hyperlipidemia: Secondary | ICD-10-CM

## 2013-01-11 DIAGNOSIS — I10 Essential (primary) hypertension: Secondary | ICD-10-CM

## 2013-02-02 ENCOUNTER — Encounter: Payer: Self-pay | Admitting: Cardiology

## 2013-03-08 LAB — LIPID PANEL
Cholesterol: 126 mg/dL (ref 0–200)
HDL: 35 mg/dL — ABNORMAL LOW (ref >39)
LDL Cholesterol: 54 mg/dL (ref 0–99)
Total CHOL/HDL Ratio: 3.6 Ratio
Triglycerides: 187 mg/dL — ABNORMAL HIGH (ref <150)
VLDL: 37 mg/dL (ref 0–40)

## 2013-03-08 LAB — COMPREHENSIVE METABOLIC PANEL
ALT: 46 U/L (ref 0–53)
AST: 30 U/L (ref 0–37)
Albumin: 4.6 g/dL (ref 3.5–5.2)
Alkaline Phosphatase: 79 U/L (ref 39–117)
BUN: 15 mg/dL (ref 6–23)
CO2: 29 mEq/L (ref 19–32)
Calcium: 10.2 mg/dL (ref 8.4–10.5)
Chloride: 100 mEq/L (ref 96–112)
Creat: 1.01 mg/dL (ref 0.50–1.35)
Glucose, Bld: 169 mg/dL — ABNORMAL HIGH (ref 70–99)
Potassium: 5.3 mEq/L (ref 3.5–5.3)
Sodium: 135 mEq/L (ref 135–145)
Total Bilirubin: 0.6 mg/dL (ref 0.3–1.2)
Total Protein: 6.6 g/dL (ref 6.0–8.3)

## 2013-03-09 ENCOUNTER — Encounter: Payer: Self-pay | Admitting: *Deleted

## 2013-03-15 ENCOUNTER — Ambulatory Visit (INDEPENDENT_AMBULATORY_CARE_PROVIDER_SITE_OTHER): Payer: 59 | Admitting: Cardiology

## 2013-03-15 ENCOUNTER — Encounter: Payer: Self-pay | Admitting: Cardiology

## 2013-03-15 VITALS — BP 142/84 | HR 67 | Ht 65.0 in | Wt 187.0 lb

## 2013-03-15 DIAGNOSIS — E785 Hyperlipidemia, unspecified: Secondary | ICD-10-CM

## 2013-03-15 DIAGNOSIS — I2510 Atherosclerotic heart disease of native coronary artery without angina pectoris: Secondary | ICD-10-CM

## 2013-03-15 DIAGNOSIS — I10 Essential (primary) hypertension: Secondary | ICD-10-CM

## 2013-03-15 MED ORDER — ATENOLOL 50 MG PO TABS: 50.0000 mg | ORAL_TABLET | Freq: Every day | ORAL | Status: DC

## 2013-03-15 NOTE — Patient Instructions (Signed)
Your physician wants you to follow-up in: ONE YEAR You will receive a reminder letter in the mail two months in advance. If you don't receive a letter, please call our office to schedule the follow-up appointment.  

## 2013-03-15 NOTE — Progress Notes (Signed)
Clinical Summary Chad Blair is a 63 y.o.male seen today for follow up of the following medical problems  1. CAD  - prior CABG 12 years ago at [facility]  - denies any recent chest pain. No SOB or DOE. Uses an exercise bike 30 min every other day without troubles. No orthopnea, no PND,no LE edema  - compliant with meds  2. Hyperlipidemia  - followed by Dr Hilma Favors in [place], his pcp  - compliant with regimen: zetia, simva, fenofirbate, niacin.  - no recent panel in our system   3. HTN  - does not check at home  - compliant with medications, including ACE-I in setting of diabetes mellitus.     Past Medical History  Diagnosis Date  . Coronary artery disease   . Hyperlipidemia   . GERD (gastroesophageal reflux disease)   . PUD (peptic ulcer disease)   . Hiatal hernia   . Diabetes mellitus      No Known Allergies   Current Outpatient Prescriptions  Medication Sig Dispense Refill  . ACCU-CHEK AVIVA PLUS test strip       . aspirin 81 MG tablet Take 81 mg by mouth daily.      Marland Kitchen atenolol (TENORMIN) 50 MG tablet Take 1 tablet (50 mg total) by mouth daily.  90 tablet  3  . atorvastatin (LIPITOR) 40 MG tablet Take 1 tablet (40 mg total) by mouth daily.  90 tablet  1  . esomeprazole (NEXIUM) 40 MG capsule Take 40 mg by mouth daily before breakfast.        . fenofibrate (TRICOR) 145 MG tablet Take 1 tablet (145 mg total) by mouth daily.  90 tablet  3  . glimepiride (AMARYL) 1 MG tablet       . Glucosamine Sulfate 1000 MG CAPS Take 1,000 mg by mouth 2 (two) times daily.        . hydrochlorothiazide (MICROZIDE) 12.5 MG capsule Take 1 capsule (12.5 mg total) by mouth daily.  90 capsule  3  . Multiple Vitamins-Minerals (MULTI FOR HIM PO) Take 1 tablet by mouth daily.        . nabumetone (RELAFEN) 750 MG tablet Take 750 mg by mouth 2 (two) times daily as needed. For arthritis       . niacin (NIASPAN) 500 MG CR tablet Take 1 tablet (500 mg total) by mouth at bedtime.  90 tablet  3   . Probiotic Product (ALIGN PO) Take by mouth. One tab a day      . sitaGLIPtan-metformin (JANUMET) 50-1000 MG per tablet Take 1 tablet by mouth 2 (two) times daily with a meal.        . trandolapril (MAVIK) 4 MG tablet Take 1 tablet (4 mg total) by mouth daily.  90 tablet  3  . ValACYclovir HCl (VALTREX PO) Take by mouth. 1 gm three times a day       No current facility-administered medications for this visit.     Past Surgical History  Procedure Laterality Date  . Coronary artery bypass graft      x4  . Cholecystectomy    . Wrist surgery      right  . Colonoscopy  11/21/2010    Procedure: COLONOSCOPY;  Surgeon: Rogene Houston, MD;  Location: [facility];  Service: Endoscopy;  Laterality: N/A;     No Known Allergies    Family History  Problem Relation Age of Onset  . Coronary artery disease Father 62  died  . Coronary artery disease Mother 12     Social History Chad Blair reports that he has never smoked. He does not have any smokeless tobacco history on file. Chad Blair reports that he does not drink alcohol.   Review of Systems CONSTITUTIONAL: No weight loss, fever, chills, weakness or fatigue.  HEENT: Eyes: No visual loss, blurred vision, double vision or yellow sclerae.No hearing loss, sneezing, congestion, runny nose or sore throat.  SKIN: No rash or itching.  CARDIOVASCULAR: per HPI RESPIRATORY: No shortness of breath, cough or sputum.  GASTROINTESTINAL: No anorexia, nausea, vomiting or diarrhea. No abdominal pain or blood.  GENITOURINARY: No burning on urination, no polyuria NEUROLOGICAL: No headache, dizziness, syncope, paralysis, ataxia, numbness or tingling in the extremities. No change in bowel or bladder control.  MUSCULOSKELETAL: No muscle, back pain, joint pain or stiffness.  LYMPHATICS: No enlarged nodes. No history of splenectomy.  PSYCHIATRIC: No history of depression or anxiety.  ENDOCRINOLOGIC: No reports of sweating, cold or heat intolerance.  No polyuria or polydipsia.  Marland Kitchen   Physical Examination p 67 bp 120/70 Wt 187 lbs BMI 31 Gen: resting comfortably, no acute distress HEENT: no scleral icterus, pupils equal round and reactive, no palptable cervical adenopathy,  CV: RRR, no m/r/g, no JVD, no carotid bruits Resp: Clear to auscultation bilaterally GI: abdomen is soft, non-tender, non-distended, normal bowel sounds, no hepatosplenomegaly MSK: extremities are warm, no edema.  Skin: warm, no rash Neuro:  no focal deficits Psych: appropriate affect   Diagnostic Studies 12/30/12 Clinic EKG: sinus, normal axis, normal interval, LAE, poor R wave progression     Assessment and Plan  1. CAD  - no current symptoms  - continue risk factor modification and secondary prevention   2. HTN  - patient is at goal (<130/80) given his diabetes, he is on an ACE-I  - continue current meds   3. Hyperlipidemia  - started recently on atorva 40mg  daily, he is tolerating well without side effects. Continue current therapy. Last lipid panel shows his LDL is at goal   Follow up 1 year    Arnoldo Lenis, M.D., F.A.C.C.

## 2013-06-21 ENCOUNTER — Telehealth: Payer: Self-pay | Admitting: Cardiology

## 2013-06-21 NOTE — Telephone Encounter (Signed)
Please see refill bin / tgs  °

## 2013-06-26 ENCOUNTER — Other Ambulatory Visit: Payer: Self-pay | Admitting: Cardiology

## 2013-09-20 ENCOUNTER — Telehealth: Payer: Self-pay | Admitting: Cardiology

## 2013-09-20 NOTE — Telephone Encounter (Signed)
Medication sent via escribe.  

## 2013-09-20 NOTE — Telephone Encounter (Signed)
Please see refill bin / tgs  °

## 2014-01-24 ENCOUNTER — Other Ambulatory Visit: Payer: Self-pay | Admitting: Cardiology

## 2014-01-24 MED ORDER — HYDROCHLOROTHIAZIDE 12.5 MG PO CAPS: 12.5000 mg | ORAL_CAPSULE | Freq: Every day | ORAL | Status: DC

## 2014-01-24 NOTE — Telephone Encounter (Signed)
Please see refill bin / tgs  °

## 2014-01-24 NOTE — Telephone Encounter (Signed)
Refill complete 

## 2014-01-25 ENCOUNTER — Other Ambulatory Visit: Payer: Self-pay | Admitting: Cardiology

## 2014-01-25 MED ORDER — HYDROCHLOROTHIAZIDE 12.5 MG PO CAPS: 12.5000 mg | ORAL_CAPSULE | Freq: Every day | ORAL | Status: DC

## 2014-01-25 MED ORDER — FENOFIBRATE 145 MG PO TABS: 145.0000 mg | ORAL_TABLET | Freq: Every day | ORAL | Status: DC

## 2014-01-25 NOTE — Telephone Encounter (Signed)
Please see refill bin / tgs  °

## 2014-01-25 NOTE — Telephone Encounter (Signed)
Refills complete escribed to [facility] order

## 2014-02-02 ENCOUNTER — Other Ambulatory Visit: Payer: Self-pay | Admitting: Cardiovascular Disease

## 2014-02-02 NOTE — Telephone Encounter (Signed)
Faxed refill auth for Niaspan to [facility]

## 2014-02-02 NOTE — Telephone Encounter (Signed)
Please see refill bin / tgs  °

## 2014-03-16 ENCOUNTER — Ambulatory Visit (INDEPENDENT_AMBULATORY_CARE_PROVIDER_SITE_OTHER): Payer: 59 | Admitting: Cardiology

## 2014-03-16 ENCOUNTER — Encounter: Payer: Self-pay | Admitting: Cardiology

## 2014-03-16 VITALS — BP 126/78 | HR 62 | Ht 67.0 in | Wt 191.0 lb

## 2014-03-16 DIAGNOSIS — I2510 Atherosclerotic heart disease of native coronary artery without angina pectoris: Secondary | ICD-10-CM

## 2014-03-16 DIAGNOSIS — I10 Essential (primary) hypertension: Secondary | ICD-10-CM

## 2014-03-16 DIAGNOSIS — E782 Mixed hyperlipidemia: Secondary | ICD-10-CM

## 2014-03-16 MED ORDER — ATORVASTATIN CALCIUM 80 MG PO TABS: 80.0000 mg | ORAL_TABLET | Freq: Every day | ORAL | Status: DC

## 2014-03-16 NOTE — Patient Instructions (Signed)
Your physician wants you to follow-up in: 1 year. You will receive a reminder letter in the mail two months in advance. If you don't receive a letter, please call our office to schedule the follow-up appointment.  Your physician has recommended you make the following change in your medication:   Stop taking Niacin  Increase Lipitor to 80 mg daily  Your physician recommends that you return for lab work Fasting ( CMet, CBC, HgB A1C, Lipid, TSH)  Thank you for choosing [facility]!

## 2014-03-16 NOTE — Progress Notes (Signed)
Clinical Summary Chad Blair is a 64 y.o.male seen today for follow up of the following medical problems.  1. CAD  - prior CABG 12 years ago at [facility]  - denies any chest pain or SOB/DOE. No LE edema, no orthopnea.  - compliant with meds  2. Hyperlipidemia  - compliant with meds - Jan 2015 TC 126 TG 187 HDL 35 LDL 54  3. HTN  - does not check at home  - compliant with medications,.   4. DM 2 - followed by endocrine.  Past Medical History  Diagnosis Date  . Coronary artery disease   . Hyperlipidemia   . GERD (gastroesophageal reflux disease)   . PUD (peptic ulcer disease)   . Hiatal hernia   . Diabetes mellitus      No Known Allergies   Current Outpatient Prescriptions  Medication Sig Dispense Refill  . ACCU-CHEK AVIVA PLUS test strip     . aspirin 81 MG tablet Take 81 mg by mouth daily.    Marland Kitchen atenolol (TENORMIN) 50 MG tablet Take 1 tablet (50 mg total) by mouth daily. 90 tablet 3  . atorvastatin (LIPITOR) 40 MG tablet Take 1 tablet (40 mg total) by mouth daily. 90 tablet 1  . esomeprazole (NEXIUM) 40 MG capsule Take 40 mg by mouth daily before breakfast.      . fenofibrate (TRICOR) 145 MG tablet Take 1 tablet (145 mg total) by mouth daily. 90 tablet 3  . glimepiride (AMARYL) 1 MG tablet     . Glucosamine Sulfate 1000 MG CAPS Take 1,000 mg by mouth 2 (two) times daily.      . hydrochlorothiazide (MICROZIDE) 12.5 MG capsule Take 1 capsule (12.5 mg total) by mouth daily. 90 capsule 3  . Multiple Vitamins-Minerals (MULTI FOR HIM PO) Take 1 tablet by mouth daily.      . nabumetone (RELAFEN) 750 MG tablet Take 750 mg by mouth 2 (two) times daily as needed. For arthritis     . niacin (NIASPAN) 500 MG CR tablet Take 1 tablet (500 mg total) by mouth at bedtime. 90 tablet 3  . Probiotic Product (ALIGN PO) Take by mouth. One tab a day    . sitaGLIPtan-metformin (JANUMET) 50-1000 MG per tablet Take 1 tablet by mouth 2 (two) times daily with a meal.      .  trandolapril (MAVIK) 4 MG tablet TAKE 1 TABLET DAILY 90 tablet 3  . ValACYclovir HCl (VALTREX PO) Take by mouth. 1 gm three times a day     No current facility-administered medications for this visit.     Past Surgical History  Procedure Laterality Date  . Coronary artery bypass graft      x4  . Cholecystectomy    . Wrist surgery      right  . Colonoscopy  11/21/2010    Procedure: COLONOSCOPY;  Surgeon: Rogene Houston, MD;  Location: [facility];  Service: Endoscopy;  Laterality: N/A;     No Known Allergies    Family History  Problem Relation Age of Onset  . Coronary artery disease Father 5    died  . Coronary artery disease Mother 38     Social History Chad Blair reports that he has never smoked. He does not have any smokeless tobacco history on file. Chad Blair reports that he does not drink alcohol.   Review of Systems CONSTITUTIONAL: No weight loss, fever, chills, weakness or fatigue.  HEENT: Eyes: No visual loss, blurred vision,  double vision or yellow sclerae.No hearing loss, sneezing, congestion, runny nose or sore throat.  SKIN: No rash or itching.  CARDIOVASCULAR: per HPI RESPIRATORY: No shortness of breath, cough or sputum.  GASTROINTESTINAL: No anorexia, nausea, vomiting or diarrhea. No abdominal pain or blood.  GENITOURINARY: No burning on urination, no polyuria NEUROLOGICAL: No headache, dizziness, syncope, paralysis, ataxia, numbness or tingling in the extremities. No change in bowel or bladder control.  MUSCULOSKELETAL: No muscle, back pain, joint pain or stiffness.  LYMPHATICS: No enlarged nodes. No history of splenectomy.  PSYCHIATRIC: No history of depression or anxiety.  ENDOCRINOLOGIC: No reports of sweating, cold or heat intolerance. No polyuria or polydipsia.  Marland Kitchen   Physical Examination p 62 bp 126/78 Wt 191 lbs BMI 30 Gen: resting comfortably, no acute distress HEENT: no scleral icterus, pupils equal round and reactive, no palptable  cervical adenopathy,  CV: RRR, no m/r/g, no JVD Resp: Clear to auscultation bilaterally GI: abdomen is soft, non-tender, non-distended, normal bowel sounds, no hepatosplenomegaly MSK: extremities are warm, no edema.  Skin: warm, no rash Neuro:  no focal deficits Psych: appropriate affect     Assessment and Plan  1. CAD  - no current symptoms  - continue risk factor modification and secondary prevention   2. HTN  - patient is at goal (<130/80) given his diabetes, he is on an ACE-I  - continue current meds   3. Hyperlipidemia  - stop niacin due to lack of proven clinical outcome benefit. Increase atorva to 80mg  daily in setting of known CAD. Repeat lipid panel.   F/u 1 year   Arnoldo Lenis, M.D

## 2014-03-17 ENCOUNTER — Encounter: Payer: Self-pay | Admitting: Cardiology

## 2014-03-17 LAB — CBC WITH DIFFERENTIAL/PLATELET
Basophils Absolute: 0.1 10*3/uL (ref 0.0–0.1)
Basophils Relative: 1 % (ref 0–1)
Eosinophils Absolute: 0.2 10*3/uL (ref 0.0–0.7)
Eosinophils Relative: 4 % (ref 0–5)
HCT: 44.3 % (ref 39.0–52.0)
Hemoglobin: 15.3 g/dL (ref 13.0–17.0)
LYMPHS PCT: 23 % (ref 12–46)
Lymphs Abs: 1.4 10*3/uL (ref 0.7–4.0)
MCH: 29.7 pg (ref 26.0–34.0)
MCHC: 34.5 g/dL (ref 30.0–36.0)
MCV: 86 fL (ref 78.0–100.0)
MONO ABS: 0.6 10*3/uL (ref 0.1–1.0)
MPV: 11.2 fL (ref 8.6–12.4)
Monocytes Relative: 10 % (ref 3–12)
Neutro Abs: 3.8 10*3/uL (ref 1.7–7.7)
Neutrophils Relative %: 62 % (ref 43–77)
Platelets: 275 10*3/uL (ref 150–400)
RBC: 5.15 MIL/uL (ref 4.22–5.81)
RDW: 13.3 % (ref 11.5–15.5)
WBC: 6.1 10*3/uL (ref 4.0–10.5)

## 2014-03-17 LAB — LIPID PANEL
CHOL/HDL RATIO: 3.8 ratio
Cholesterol: 111 mg/dL (ref 0–200)
HDL: 29 mg/dL — ABNORMAL LOW (ref >39)
LDL Cholesterol: 46 mg/dL (ref 0–99)
Triglycerides: 182 mg/dL — ABNORMAL HIGH (ref <150)
VLDL: 36 mg/dL (ref 0–40)

## 2014-03-17 LAB — COMPREHENSIVE METABOLIC PANEL
ALBUMIN: 4.5 g/dL (ref 3.5–5.2)
ALT: 39 U/L (ref 0–53)
AST: 25 U/L (ref 0–37)
Alkaline Phosphatase: 81 U/L (ref 39–117)
BILIRUBIN TOTAL: 0.4 mg/dL (ref 0.2–1.2)
BUN: 17 mg/dL (ref 6–23)
CALCIUM: 9.8 mg/dL (ref 8.4–10.5)
CO2: 28 meq/L (ref 19–32)
Chloride: 98 mEq/L (ref 96–112)
Creat: 1.04 mg/dL (ref 0.50–1.35)
GLUCOSE: 180 mg/dL — AB (ref 70–99)
POTASSIUM: 4.6 meq/L (ref 3.5–5.3)
SODIUM: 134 meq/L — AB (ref 135–145)
TOTAL PROTEIN: 6.5 g/dL (ref 6.0–8.3)

## 2014-03-17 LAB — HEMOGLOBIN A1C
Hgb A1c MFr Bld: 7.9 % — ABNORMAL HIGH (ref <5.7)
MEAN PLASMA GLUCOSE: 180 mg/dL — AB (ref <117)

## 2014-03-17 LAB — TSH: TSH: 2.935 u[IU]/mL (ref 0.350–4.500)

## 2014-03-21 NOTE — Addendum Note (Signed)
Addended by: Levonne Hubert on: 03/21/2014 01:36 PM   Modules accepted: Orders

## 2014-03-22 ENCOUNTER — Telehealth: Payer: Self-pay

## 2014-03-22 NOTE — Telephone Encounter (Signed)
Results given to patient, will fax to endocrinologist Dr.Balan and pcp and mailed pt copy

## 2014-03-22 NOTE — Telephone Encounter (Signed)
-----   Message from Arnoldo Lenis, MD sent at 03/22/2014 10:36 AM EST ----- Labs overall look pretty good. Normal kidney and liver function. His bad cholesterol is well controlled. His good cholesterol continues to be low, increased diet and exercise will help improve this number. Diabetes test shows his diabetes is not well controlled, please forward results to his endocrinologist  Zandra Abts MD

## 2014-04-04 ENCOUNTER — Other Ambulatory Visit: Payer: Self-pay

## 2014-04-04 ENCOUNTER — Telehealth: Payer: Self-pay | Admitting: Cardiology

## 2014-04-04 MED ORDER — ATENOLOL 50 MG PO TABS: 50.0000 mg | ORAL_TABLET | Freq: Every day | ORAL | Status: DC

## 2014-04-04 NOTE — Telephone Encounter (Signed)
escribed tenormin refill

## 2014-04-04 NOTE — Telephone Encounter (Signed)
Please see refill bin / tgs  °

## 2014-04-20 ENCOUNTER — Encounter: Payer: Self-pay | Admitting: Physical Therapy

## 2014-04-20 ENCOUNTER — Ambulatory Visit: Payer: 59 | Attending: Endocrinology | Admitting: Physical Therapy

## 2014-04-20 DIAGNOSIS — H8111 Benign paroxysmal vertigo, right ear: Secondary | ICD-10-CM | POA: Diagnosis present

## 2014-04-20 NOTE — Therapy (Signed)
[facility] [address] [place], [place], [address] Phone: [phone]   Fax:  [phone]  Physical Therapy Evaluation  Patient Details  Name: Chad RAJEWSKI Sr. MRN: [id number] Date of Birth: 07-Jan-1951 Referring Provider:  Sharilyn Sites, MD  Encounter Date: 04/20/2014      PT End of Session - 04/20/14 2030    Visit Number 1   Number of Visits 4   Date for PT Re-Evaluation 05/18/14   Authorization Type [employer]   PT Start Time 0935   PT Stop Time 1020   PT Time Calculation (min) 45 min      Past Medical History  Diagnosis Date  . Coronary artery disease   . Hyperlipidemia   . GERD (gastroesophageal reflux disease)   . PUD (peptic ulcer disease)   . Hiatal hernia   . Diabetes mellitus     Past Surgical History  Procedure Laterality Date  . Coronary artery bypass graft      x4  . Cholecystectomy    . Wrist surgery      right  . Colonoscopy  11/21/2010    Procedure: COLONOSCOPY;  Surgeon: Rogene Houston, MD;  [facility];  Service: Endoscopy;  Laterality: N/A;    There were no vitals taken for this visit.  Visit Diagnosis:  Benign paroxysmal positional vertigo, right - Plan: PT plan of care cert/re-cert      Subjective Assessment - 04/20/14 2024    Symptoms Pt. reports vertigo has much improved from initial onset about 1 month ago; reports vertigo with leaning forward, coming up to sitting and sitting up   Patient Stated Goals resolve the vertigo   Currently in Pain? No/denies              Vestibular Assessment - 04/20/14 2028    Vestibular Assessment   General Observation pt. presents with minimal c/o vertigo at this time - amb. independently    Symptom Behavior   Type of Dizziness Spinning   Duration of Dizziness seconds   Aggravating Factors Lying supine;Forward bending   Relieving Factors Rest   Occulomotor Exam   Occulomotor Alignment Normal   Positional Testing   Dix-Hallpike Dix-Hallpike Right   Positional Sensitivities   Rolling Right Mild dizziness   Rolling Left No dizziness     Positive R side-lying test and R Dix-Hallpike test with c/o vertigo in test positions; no c/o vertigo in left side-lying or with L  Dix-Hallpike test  Pt. Was treated with Epley's maneuver x 3 reps with no nystagmus and no vertigo reported on rep #3; pt. Stated he felt Much "clearer" and better after treatment than he did prior to start of PT evaluation.  Pt. Was given information on etiology of BPPV  and he verbalized understanding.                PT Education - 04/20/14 2029    Education provided Yes   Education Details etiology of BPPV; Epley's maneuver described   Person(s) Educated Patient   Methods Explanation   Comprehension Verbalized understanding          PT Short Term Goals - 04/20/14 2033    PT SHORT TERM GOAL #1   Title same as LTg's           PT Long Term Goals - 04/20/14 2033    PT LONG TERM GOAL #1   Title Pt. will report no vertigo with bed mobiltiy or ambulation.  Baseline 05-18-14   Time 4   Period Weeks   Status New   PT LONG TERM GOAL #2   Title Pt. will have a (-) R Dix-Hallpike test to indicate that R BPPV has resolved.   Baseline 05-18-14   Time 4   Period Weeks   Status New   PT LONG TERM GOAL #3   Title Independent in Brandt-Daroff exercises prn   Baseline 05-18-14   Time 4   Period Weeks   Status New               Plan - 04/20/14 2031    Clinical Impression Statement Pt. has positive R Dix-Hallpike test with very minimal nystagmus noted but c/o vertigo in test position and with return to upright   Pt will benefit from skilled therapeutic intervention in order to improve on the following deficits --  vertigo   Rehab Potential Good   PT Frequency 1x / week   PT Duration 3 weeks   PT Treatment/Interventions Therapeutic activities;Patient/family education;ADLs/Self Care Home Management;Other  (comment);Neuromuscular re-education  canalith repositioning    PT Next Visit Plan Epleys for R BPPV   PT Home Exercise Plan Brandt-Daroff prn   Consulted and Agree with Plan of Care Patient         Problem List Patient Active Problem List   Diagnosis Date Noted  . Coronary artery disease 11/27/2010  . HYPERTENSION, BENIGN 12/16/2008  . DIABETES MELLITUS, TYPE II 11/21/2008  . HYPERLIPIDEMIA-MIXED 11/21/2008  . GERD 11/21/2008  . HIATAL HERNIA 11/21/2008  . PUD, HX OF 11/21/2008    Alda Lea, PT 04/20/2014, 8:41 PM  [facility] [address] [place], [place], [address] Phone: [phone]   Fax:  [phone]

## 2014-04-20 NOTE — Patient Instructions (Signed)
Benign Positional Vertigo Vertigo means you feel like you or your surroundings are moving when they are not. Benign positional vertigo is the most common form of vertigo. Benign means that the cause of your condition is not serious. Benign positional vertigo is more common in older adults. CAUSES  Benign positional vertigo is the result of an upset in the labyrinth system. This is an area in the middle ear that helps control your balance. This may be caused by a viral infection, head injury, or repetitive motion. However, often no specific cause is found. SYMPTOMS  Symptoms of benign positional vertigo occur when you move your head or eyes in different directions. Some of the symptoms may include:  Loss of balance and falls.  Vomiting.  Blurred vision.  Dizziness.  Nausea.  Involuntary eye movements (nystagmus). DIAGNOSIS  Benign positional vertigo is usually diagnosed by physical exam. If the specific cause of your benign positional vertigo is unknown, your caregiver may perform imaging tests, such as magnetic resonance imaging (MRI) or computed tomography (CT). TREATMENT  Your caregiver may recommend movements or procedures to correct the benign positional vertigo. Medicines such as meclizine, benzodiazepines, and medicines for nausea may be used to treat your symptoms. In rare cases, if your symptoms are caused by certain conditions that affect the inner ear, you may need surgery. HOME CARE INSTRUCTIONS   Follow your caregiver's instructions.  Move slowly. Do not make sudden body or head movements.  Avoid driving.  Avoid operating heavy machinery.  Avoid performing any tasks that would be dangerous to you or others during a vertigo episode.  Drink enough fluids to keep your urine clear or pale yellow. SEEK IMMEDIATE MEDICAL CARE IF:   You develop problems with walking, weakness, numbness, or using your arms, hands, or legs.  You have difficulty speaking.  You develop  severe headaches.  Your nausea or vomiting continues or gets worse.  You develop visual changes.  Your family or friends notice any behavioral changes.  Your condition gets worse.  You have a fever.  You develop a stiff neck or sensitivity to light. MAKE SURE YOU:   Understand these instructions.  Will watch your condition.  Will get help right away if you are not doing well or get worse. Document Released: 11/19/2005 Document Revised: 05/06/2011 Document Reviewed: 11/01/2010 ExitCare Patient Information 2015 [employer]. This information is not intended to replace advice given to you by your health care provider. Make sure you discuss any questions you have with your health care provider.    

## 2014-05-17 ENCOUNTER — Ambulatory Visit: Payer: 59 | Admitting: Physical Therapy

## 2014-08-25 ENCOUNTER — Other Ambulatory Visit: Payer: Self-pay

## 2014-08-25 MED ORDER — TRANDOLAPRIL 4 MG PO TABS: 4.0000 mg | ORAL_TABLET | Freq: Every day | ORAL | Status: DC

## 2015-01-09 ENCOUNTER — Other Ambulatory Visit: Payer: Self-pay

## 2015-01-09 MED ORDER — FENOFIBRATE 145 MG PO TABS: 145.0000 mg | ORAL_TABLET | Freq: Every day | ORAL | Status: DC

## 2015-02-28 ENCOUNTER — Other Ambulatory Visit: Payer: Self-pay | Admitting: *Deleted

## 2015-02-28 DIAGNOSIS — E782 Mixed hyperlipidemia: Secondary | ICD-10-CM

## 2015-02-28 MED ORDER — ATORVASTATIN CALCIUM 80 MG PO TABS: 80.0000 mg | ORAL_TABLET | Freq: Every day | ORAL | Status: DC

## 2015-03-08 ENCOUNTER — Encounter: Payer: Self-pay | Admitting: Cardiology

## 2015-03-08 ENCOUNTER — Ambulatory Visit (INDEPENDENT_AMBULATORY_CARE_PROVIDER_SITE_OTHER): Payer: 59 | Admitting: Cardiology

## 2015-03-08 VITALS — BP 120/72 | HR 78 | Ht 67.5 in | Wt 181.6 lb

## 2015-03-08 DIAGNOSIS — Z79899 Other long term (current) drug therapy: Secondary | ICD-10-CM | POA: Diagnosis not present

## 2015-03-08 DIAGNOSIS — I2510 Atherosclerotic heart disease of native coronary artery without angina pectoris: Secondary | ICD-10-CM | POA: Diagnosis not present

## 2015-03-08 DIAGNOSIS — I10 Essential (primary) hypertension: Secondary | ICD-10-CM

## 2015-03-08 DIAGNOSIS — E119 Type 2 diabetes mellitus without complications: Secondary | ICD-10-CM

## 2015-03-08 DIAGNOSIS — E782 Mixed hyperlipidemia: Secondary | ICD-10-CM

## 2015-03-08 DIAGNOSIS — E785 Hyperlipidemia, unspecified: Secondary | ICD-10-CM

## 2015-03-08 LAB — CBC WITH DIFFERENTIAL/PLATELET
BASOS ABS: 0 10*3/uL (ref 0.0–0.1)
BASOS PCT: 0 % (ref 0–1)
Eosinophils Absolute: 0.1 10*3/uL (ref 0.0–0.7)
Eosinophils Relative: 2 % (ref 0–5)
HEMATOCRIT: 44.8 % (ref 39.0–52.0)
HEMOGLOBIN: 15.3 g/dL (ref 13.0–17.0)
LYMPHS PCT: 24 % (ref 12–46)
Lymphs Abs: 1.2 10*3/uL (ref 0.7–4.0)
MCH: 29.8 pg (ref 26.0–34.0)
MCHC: 34.2 g/dL (ref 30.0–36.0)
MCV: 87.2 fL (ref 78.0–100.0)
MONO ABS: 0.6 10*3/uL (ref 0.1–1.0)
MPV: 10.5 fL (ref 8.6–12.4)
Monocytes Relative: 11 % (ref 3–12)
NEUTROS ABS: 3.2 10*3/uL (ref 1.7–7.7)
NEUTROS PCT: 63 % (ref 43–77)
Platelets: 301 10*3/uL (ref 150–400)
RBC: 5.14 MIL/uL (ref 4.22–5.81)
RDW: 14 % (ref 11.5–15.5)
WBC: 5 10*3/uL (ref 4.0–10.5)

## 2015-03-08 MED ORDER — HYDROCHLOROTHIAZIDE 12.5 MG PO CAPS: 12.5000 mg | ORAL_CAPSULE | Freq: Every day | ORAL | Status: DC

## 2015-03-08 MED ORDER — ATENOLOL 50 MG PO TABS: 50.0000 mg | ORAL_TABLET | Freq: Every day | ORAL | Status: DC

## 2015-03-08 MED ORDER — ATORVASTATIN CALCIUM 80 MG PO TABS: 80.0000 mg | ORAL_TABLET | Freq: Every day | ORAL | Status: DC

## 2015-03-08 NOTE — Progress Notes (Signed)
Patient ID: Chad Blair., male   DOB: [birth year], 65 y.o.   MRN: [id number]     Clinical Summary Mr. Dolloff is a 65 y.o.male seen today for follow up of the following medical problems.  1. CAD  - prior CABG approx 16 years ago at [facility]  - denies any chest pain. No SOB or DOE since last visit.  - compliant with meds  2. Hyperlipidemia  - compliant with meds - last lipid panel Jan 2016 TC 11 TG 182 HDL 29 LDL 46  3. HTN  - does not check at home  - compliant with medications.  4. DM 2 - followed by endocrine.     SH: retired [employer]. Spends most of his time hunting and fishing, spending time with his 2 grandsons.  Past Medical History  Diagnosis Date  . Coronary artery disease   . Hyperlipidemia   . GERD (gastroesophageal reflux disease)   . PUD (peptic ulcer disease)   . Hiatal hernia   . Diabetes mellitus      No Known Allergies   Current Outpatient Prescriptions  Medication Sig Dispense Refill  . ACCU-CHEK AVIVA PLUS test strip     . aspirin 81 MG tablet Take 81 mg by mouth daily.    Marland Kitchen atenolol (TENORMIN) 50 MG tablet Take 1 tablet (50 mg total) by mouth daily. 90 tablet 3  . atorvastatin (LIPITOR) 80 MG tablet Take 1 tablet (80 mg total) by mouth daily. 90 tablet 0  . esomeprazole (NEXIUM) 40 MG capsule Take 40 mg by mouth daily before breakfast.      . fenofibrate (TRICOR) 145 MG tablet Take 1 tablet (145 mg total) by mouth daily. 90 tablet 3  . glimepiride (AMARYL) 1 MG tablet     . hydrochlorothiazide (MICROZIDE) 12.5 MG capsule Take 1 capsule (12.5 mg total) by mouth daily. 90 capsule 3  . Multiple Vitamins-Minerals (MULTI FOR HIM PO) Take 1 tablet by mouth daily.      . nabumetone (RELAFEN) 750 MG tablet Take 750 mg by mouth 2 (two) times daily as needed. For arthritis     . Probiotic Product (ALIGN PO) Take by mouth. One tab a day    . sitaGLIPtan-metformin (JANUMET) 50-1000 MG per tablet Take 1 tablet by mouth 2 (two) times daily  with a meal.      . trandolapril (MAVIK) 4 MG tablet Take 1 tablet (4 mg total) by mouth daily. 90 tablet 3   No current facility-administered medications for this visit.     Past Surgical History  Procedure Laterality Date  . Coronary artery bypass graft      x4  . Cholecystectomy    . Wrist surgery      right  . Colonoscopy  11/21/2010    Procedure: COLONOSCOPY;  Surgeon: Rogene Houston, MD;  [facility];  Service: Endoscopy;  Laterality: N/A;     No Known Allergies    Family History  Problem Relation Age of Onset  . Coronary artery disease Father 47    died  . Coronary artery disease Mother 58     Social History Mr. Danger reports that he has never smoked. He has never used smokeless tobacco. Mr. Eyles reports that he does not drink alcohol.   Review of Systems CONSTITUTIONAL: No weight loss, fever, chills, weakness or fatigue.  HEENT: Eyes: No visual loss, blurred vision, double vision or yellow sclerae.No hearing loss, sneezing, congestion, runny nose or sore throat.  SKIN: No rash or itching.  CARDIOVASCULAR: per HPI RESPIRATORY: No shortness of breath, cough or sputum.  GASTROINTESTINAL: No anorexia, nausea, vomiting or diarrhea. No abdominal pain or blood.  GENITOURINARY: No burning on urination, no polyuria NEUROLOGICAL: No headache, dizziness, syncope, paralysis, ataxia, numbness or tingling in the extremities. No change in bowel or bladder control.  MUSCULOSKELETAL: No muscle, back pain, joint pain or stiffness.  LYMPHATICS: No enlarged nodes. No history of splenectomy.  PSYCHIATRIC: No history of depression or anxiety.  ENDOCRINOLOGIC: No reports of sweating, cold or heat intolerance. No polyuria or polydipsia.  Marland Kitchen   Physical Examination Filed Vitals:   03/08/15 0956  BP: 120/72  Pulse: 78   Filed Vitals:   03/08/15 0956  Height: 5' 7.5" (1.715 m)  Weight: 181 lb 9.6 oz (82.373 kg)    Gen: resting comfortably, no acute  distress HEENT: no scleral icterus, pupils equal round and reactive, no palptable cervical adenopathy,  CV: RRR, no m/r/g. No jvd Resp: Clear to auscultation bilaterally GI: abdomen is soft, non-tender, non-distended, normal bowel sounds, no hepatosplenomegaly MSK: extremities are warm, no edema.  Skin: warm, no rash Neuro:  no focal deficits Psych: appropriate affect   Diagnostic Studies 03/08/15 Clinic EKG (performed and reviewed): NSR    Assessment and Plan   1. CAD  - no current symptoms  - continue risk current meds  2. HTN  - patient is at goal (<130/80) given his diabetes - continue current meds   3. Hyperlipidemia  - continue high dose statin. Stop fenofibrate due to lack of additional clinical benefit.    F/u 1 year. Order annual labs.     Arnoldo Lenis, M.D.

## 2015-03-08 NOTE — Patient Instructions (Signed)
Medication Instructions:  Stop fenofibrate  Labwork: Your physician recommends that you return for lab work in: Today Cbc tsh a1c cmet Lipid Magnesium psa Urine microalbumine   Testing/Procedures: none  Follow-Up: Your physician wants you to follow-up in: 1 year with Dr. Harl Bowie. You will receive a reminder letter in the mail two months in advance. If you don't receive a letter, please call our office to schedule the follow-up appointment.   Any Other Special Instructions Will Be Listed Below (If Applicable).     If you need a refill on your cardiac medications before your next appointment, please call your pharmacy.

## 2015-03-09 ENCOUNTER — Encounter: Payer: 59 | Attending: Endocrinology | Admitting: *Deleted

## 2015-03-09 DIAGNOSIS — E119 Type 2 diabetes mellitus without complications: Secondary | ICD-10-CM | POA: Diagnosis not present

## 2015-03-09 DIAGNOSIS — Z713 Dietary counseling and surveillance: Secondary | ICD-10-CM | POA: Insufficient documentation

## 2015-03-09 LAB — COMPREHENSIVE METABOLIC PANEL
ALBUMIN: 4.5 g/dL (ref 3.6–5.1)
ALK PHOS: 74 U/L (ref 40–115)
ALT: 57 U/L — AB (ref 9–46)
AST: 34 U/L (ref 10–35)
BILIRUBIN TOTAL: 0.6 mg/dL (ref 0.2–1.2)
BUN: 15 mg/dL (ref 7–25)
CALCIUM: 9.7 mg/dL (ref 8.6–10.3)
CO2: 26 mmol/L (ref 20–31)
CREATININE: 0.98 mg/dL (ref 0.70–1.25)
Chloride: 100 mmol/L (ref 98–110)
Glucose, Bld: 138 mg/dL — ABNORMAL HIGH (ref 65–99)
Potassium: 5 mmol/L (ref 3.5–5.3)
SODIUM: 134 mmol/L — AB (ref 135–146)
TOTAL PROTEIN: 6.5 g/dL (ref 6.1–8.1)

## 2015-03-09 LAB — LIPID PANEL
Cholesterol: 108 mg/dL — ABNORMAL LOW (ref 125–200)
HDL: 32 mg/dL — AB (ref >=40)
LDL CALC: 50 mg/dL (ref <130)
Total CHOL/HDL Ratio: 3.4 Ratio (ref <=5.0)
Triglycerides: 129 mg/dL (ref <150)
VLDL: 26 mg/dL (ref <30)

## 2015-03-09 LAB — MAGNESIUM: MAGNESIUM: 2 mg/dL (ref 1.5–2.5)

## 2015-03-09 LAB — HEMOGLOBIN A1C
HEMOGLOBIN A1C: 8.1 % — AB (ref <5.7)
MEAN PLASMA GLUCOSE: 186 mg/dL — AB (ref <117)

## 2015-03-09 LAB — MICROALBUMIN / CREATININE URINE RATIO
Creatinine, Urine: 97 mg/dL (ref 20–370)
MICROALB UR: 0.2 mg/dL
MICROALB/CREAT RATIO: 2 ug/mg{creat} (ref <30)

## 2015-03-09 LAB — TSH: TSH: 1.922 u[IU]/mL (ref 0.350–4.500)

## 2015-03-09 LAB — PSA: PSA: 0.52 ng/mL (ref <=4.00)

## 2015-03-09 NOTE — Patient Instructions (Addendum)
Plan:  Aim for 3-4 Carb Choices per meal (45-60 grams) +/- 1 either way  Aim for 0-15 Carbs per snack if hungry  Include protein in moderation with your meals and snacks Consider reading food labels for Total Carbohydrate and Fat Grams of foods Consider  increasing your activity level by walking for 30 minutes daily as tolerated  (Goal 150 minutes per week) Consider checking BG at alternate times per day to include fasting (first thing in the morning) and 2 hours after the  First bite of dinner as directed by MD  Continue taking medication as directed by MD  [place] Balance Breaks

## 2015-03-14 ENCOUNTER — Encounter: Payer: Self-pay | Admitting: *Deleted

## 2015-03-14 NOTE — Progress Notes (Signed)
Diabetes Self-Management Education  Visit Type: First/Initial  Appt. Start Time: 0900 Appt. End Time: 1100  03/14/2015  Chad Blair, identified by name and date of birth, is a 65 y.o. male with a diagnosis of Diabetes: Type 2. Chad Blair "Chad Blair" presents with his wife Chad Blair for DSME. He has been testing his glucose FBS and prior to his dinner meal. I called Dr. Almetta Lovely office to confirm testing protocol. Her preference is actually FBS & 2hpp. We will initiate this change. [name] and his wife are very engaged and eager to make modifications to facilitate better glucose management.  ASSESSMENT  There were no vitals taken for this visit. There is no weight on file to calculate BMI.      Diabetes Self-Management Education - 03/09/15 0912    Visit Information   Visit Type First/Initial   Initial Visit   Diabetes Type Type 2   Are you currently following a meal plan? No   Are you taking your medications as prescribed? Not on Medications   Date Diagnosed 2001   Health Coping   How would you rate your overall health? Good   Psychosocial Assessment   Patient Belief/Attitude about Diabetes Motivated to manage diabetes   Self-care barriers None   Self-management support Doctor's office;Family;CDE visits   Other persons present Patient;Spouse/SO  Chad Blair   Patient Concerns Nutrition/Meal planning;Medication;Monitoring;Healthy Lifestyle;Glycemic Control   Special Needs None   Preferred Learning Style No preference indicated   Learning Readiness Change in progress   How often do you need to have someone help you when you read instructions, pamphlets, or other written materials from your doctor or pharmacy? 2 - Rarely   Complications   Last HgB A1C per patient/outside source 7.9 %   How often do you check your blood sugar? 1-2 times/day   Fasting Blood glucose range (mg/dL) 70-129;130-179  100-150   Postprandial Blood glucose range (mg/dL) >200  Patient has been incorrectly testing prior to  meal vs 2hpp   Number of hypoglycemic episodes per month 0   Have you had a dilated eye exam in the past 12 months? Yes   Have you had a dental exam in the past 12 months? Yes   Are you checking your feet? Yes   How many days per week are you checking your feet? 5   Dietary Intake   Breakfast bacon, eggs, natures Own reduced calorie bread, coffee / PB & banana sandwich   Lunch chicken wings/ roasted chicken bites (no skin), pintos, unsweet tea (splenda)   Dinner baked chicken, stove top stuffing, gravy, green beans w/onions   Beverage(s) diet Sundrop, coffe, caffeine free sweet tea/splenda   Exercise   Exercise Type Light (walking / raking leaves)   How many days per week to you exercise? 6   How many minutes per day do you exercise? 30   Total minutes per week of exercise 180   Patient Education   Previous Diabetes Education Yes (please comment)  2001   Disease state  Definition of diabetes, type 1 and 2, and the diagnosis of diabetes;Factors that contribute to the development of diabetes   Nutrition management  Role of diet in the treatment of diabetes and the relationship between the three main macronutrients and blood glucose level;Food label reading, portion sizes and measuring food.;Carbohydrate counting;Reviewed blood glucose goals for pre and post meals and how to evaluate the patients' food intake on their blood glucose level.;Meal options for control of blood glucose level and chronic complications.;Information  on hints to eating out and maintain blood glucose control.;Meal timing in regards to the patients' current diabetes medication.   Physical activity and exercise  Role of exercise on diabetes management, blood pressure control and cardiac health.   Medications Reviewed patients medication for diabetes, action, purpose, timing of dose and side effects.   Monitoring Purpose and frequency of SMBG.;Identified appropriate SMBG and/or A1C goals.   Chronic complications Relationship  between chronic complications and blood glucose control   Individualized Goals (developed by patient)   Nutrition General guidelines for healthy choices and portions discussed   Physical Activity Exercise 5-7 days per week;30 minutes per day   Monitoring  test blood glucose pre and post meals as discussed   Outcomes   Expected Outcomes Demonstrated interest in learning. Expect positive outcomes   Future DMSE PRN;4-6 wks   Program Status Not Completed      Individualized Plan for Diabetes Self-Management Training:   Learning Objective:  Patient will have a greater understanding of diabetes self-management. Patient education plan is to attend individual and/or group sessions per assessed needs and concerns.   Plan:   Patient Instructions  Plan:  Aim for 3-4 Carb Choices per meal (45-60 grams) +/- 1 either way  Aim for 0-15 Carbs per snack if hungry  Include protein in moderation with your meals and snacks Consider reading food labels for Total Carbohydrate and Fat Grams of foods Consider  increasing your activity level by walking for 30 minutes daily as tolerated  (Goal 150 minutes per week) Consider checking BG at alternate times per day to include fasting (first thing in the morning) and 2 hours after the  First bite of dinner as directed by MD  Continue taking medication as directed by MD  [place] Balance Breaks  Expected Outcomes:  Demonstrated interest in learning. Expect positive outcomes  Education material provided: Living Well with Diabetes, A1C conversion sheet, Meal plan card, My Plate and Snack sheet  If problems or questions, patient to contact team via:  Phone  Future DSME appointment: PRN, 4-6 wks

## 2015-04-27 ENCOUNTER — Encounter: Payer: 59 | Attending: Endocrinology | Admitting: *Deleted

## 2015-04-27 DIAGNOSIS — Z713 Dietary counseling and surveillance: Secondary | ICD-10-CM | POA: Insufficient documentation

## 2015-04-27 DIAGNOSIS — E119 Type 2 diabetes mellitus without complications: Secondary | ICD-10-CM | POA: Insufficient documentation

## 2015-05-11 ENCOUNTER — Other Ambulatory Visit: Payer: Self-pay

## 2015-05-11 DIAGNOSIS — E782 Mixed hyperlipidemia: Secondary | ICD-10-CM

## 2015-05-11 MED ORDER — ATORVASTATIN CALCIUM 80 MG PO TABS: 80.0000 mg | ORAL_TABLET | Freq: Every day | ORAL | Status: DC

## 2015-08-18 ENCOUNTER — Other Ambulatory Visit: Payer: Self-pay | Admitting: Cardiology

## 2015-08-21 ENCOUNTER — Other Ambulatory Visit: Payer: Self-pay

## 2015-08-21 MED ORDER — TRANDOLAPRIL 4 MG PO TABS: 4.0000 mg | ORAL_TABLET | Freq: Every day | ORAL | Status: DC

## 2015-08-21 NOTE — Telephone Encounter (Signed)
Refill complete 

## 2015-10-16 ENCOUNTER — Other Ambulatory Visit: Payer: Self-pay

## 2015-10-16 MED ORDER — FENOFIBRATE 145 MG PO TABS: 145.0000 mg | ORAL_TABLET | Freq: Every day | ORAL | 2 refills | Status: DC

## 2015-10-16 NOTE — Telephone Encounter (Signed)
Fenofibrate refilled. 

## 2015-12-01 DIAGNOSIS — Z23 Encounter for immunization: Secondary | ICD-10-CM | POA: Diagnosis not present

## 2015-12-25 DIAGNOSIS — E7800 Pure hypercholesterolemia, unspecified: Secondary | ICD-10-CM | POA: Diagnosis not present

## 2015-12-25 DIAGNOSIS — E1165 Type 2 diabetes mellitus with hyperglycemia: Secondary | ICD-10-CM | POA: Diagnosis not present

## 2015-12-25 DIAGNOSIS — I10 Essential (primary) hypertension: Secondary | ICD-10-CM | POA: Diagnosis not present

## 2016-03-07 ENCOUNTER — Ambulatory Visit (INDEPENDENT_AMBULATORY_CARE_PROVIDER_SITE_OTHER): Payer: Medicare Other | Admitting: Cardiology

## 2016-03-07 ENCOUNTER — Other Ambulatory Visit (HOSPITAL_COMMUNITY)
Admission: RE | Admit: 2016-03-07 | Discharge: 2016-03-07 | Disposition: A | Discharge status: Home / Self Care | Payer: Medicare Other | Source: Ambulatory Visit | Attending: Cardiology | Admitting: Cardiology

## 2016-03-07 ENCOUNTER — Encounter: Payer: Self-pay | Admitting: Cardiology

## 2016-03-07 VITALS — BP 126/72 | HR 77 | Ht 68.0 in | Wt 179.0 lb

## 2016-03-07 DIAGNOSIS — E782 Mixed hyperlipidemia: Secondary | ICD-10-CM

## 2016-03-07 DIAGNOSIS — I2510 Atherosclerotic heart disease of native coronary artery without angina pectoris: Secondary | ICD-10-CM

## 2016-03-07 DIAGNOSIS — I10 Essential (primary) hypertension: Secondary | ICD-10-CM | POA: Diagnosis not present

## 2016-03-07 LAB — LIPID PANEL
CHOL/HDL RATIO: 3.4 ratio
Cholesterol: 99 mg/dL (ref 0–200)
HDL: 29 mg/dL — AB (ref >40)
LDL Cholesterol: 55 mg/dL (ref 0–99)
Triglycerides: 75 mg/dL (ref <150)
VLDL: 15 mg/dL (ref 0–40)

## 2016-03-07 LAB — CBC WITH DIFFERENTIAL/PLATELET
BASOS ABS: 0 10*3/uL (ref 0.0–0.1)
Basophils Relative: 1 %
EOS PCT: 2 %
Eosinophils Absolute: 0.1 10*3/uL (ref 0.0–0.7)
HEMATOCRIT: 41.7 % (ref 39.0–52.0)
Hemoglobin: 15.1 g/dL (ref 13.0–17.0)
LYMPHS PCT: 20 %
Lymphs Abs: 1.3 10*3/uL (ref 0.7–4.0)
MCH: 30.4 pg (ref 26.0–34.0)
MCHC: 36.2 g/dL — ABNORMAL HIGH (ref 30.0–36.0)
MCV: 83.9 fL (ref 78.0–100.0)
Monocytes Absolute: 0.7 10*3/uL (ref 0.1–1.0)
Monocytes Relative: 11 %
NEUTROS ABS: 4.3 10*3/uL (ref 1.7–7.7)
Neutrophils Relative %: 66 %
PLATELETS: 246 10*3/uL (ref 150–400)
RBC: 4.97 MIL/uL (ref 4.22–5.81)
RDW: 13 % (ref 11.5–15.5)
WBC: 6.3 10*3/uL (ref 4.0–10.5)

## 2016-03-07 LAB — COMPREHENSIVE METABOLIC PANEL
ALT: 56 U/L (ref 17–63)
ANION GAP: 6 (ref 5–15)
AST: 32 U/L (ref 15–41)
Albumin: 4.3 g/dL (ref 3.5–5.0)
Alkaline Phosphatase: 64 U/L (ref 38–126)
BUN: 19 mg/dL (ref 6–20)
CHLORIDE: 97 mmol/L — AB (ref 101–111)
CO2: 27 mmol/L (ref 22–32)
Calcium: 9.3 mg/dL (ref 8.9–10.3)
Creatinine, Ser: 0.95 mg/dL (ref 0.61–1.24)
Glucose, Bld: 169 mg/dL — ABNORMAL HIGH (ref 65–99)
POTASSIUM: 4.5 mmol/L (ref 3.5–5.1)
Sodium: 130 mmol/L — ABNORMAL LOW (ref 135–145)
Total Bilirubin: 0.6 mg/dL (ref 0.3–1.2)
Total Protein: 6.8 g/dL (ref 6.5–8.1)

## 2016-03-07 LAB — MAGNESIUM: MAGNESIUM: 1.8 mg/dL (ref 1.7–2.4)

## 2016-03-07 LAB — TSH: TSH: 1.831 u[IU]/mL (ref 0.350–4.500)

## 2016-03-07 LAB — PSA: PSA: 0.4 ng/mL (ref 0.00–4.00)

## 2016-03-07 MED ORDER — ATORVASTATIN CALCIUM 80 MG PO TABS: 80.0000 mg | ORAL_TABLET | Freq: Every day | ORAL | 3 refills | Status: DC

## 2016-03-07 MED ORDER — TRANDOLAPRIL 4 MG PO TABS: 4.0000 mg | ORAL_TABLET | Freq: Every day | ORAL | 3 refills | Status: DC

## 2016-03-07 MED ORDER — HYDROCHLOROTHIAZIDE 12.5 MG PO CAPS: 12.5000 mg | ORAL_CAPSULE | Freq: Every day | ORAL | 3 refills | Status: DC

## 2016-03-07 MED ORDER — ATENOLOL 50 MG PO TABS: 50.0000 mg | ORAL_TABLET | Freq: Every day | ORAL | 3 refills | Status: DC

## 2016-03-07 MED ORDER — FENOFIBRATE 145 MG PO TABS: 145.0000 mg | ORAL_TABLET | Freq: Every day | ORAL | 2 refills | Status: DC

## 2016-03-07 NOTE — Patient Instructions (Addendum)
Medication Instructions:  Your physician recommends that you continue on your current medications as directed. Please refer to the Current Medication list given to you today.  Labwork: Your physician recommends that you return for lab work in: today Fasting lipid cmet Cbc Magnesium tsh psa   Testing/Procedures: none  Follow-Up: Your physician wants you to follow-up in: 1 year.  .  You will receive a reminder letter in the mail two months in advance. If you don't receive a letter, please call our office to schedule the follow-up appointment.   Any Other Special Instructions Will Be Listed Below (If Applicable).     If you need a refill on your cardiac medications before your next appointment, please call your pharmacy.

## 2016-03-07 NOTE — Progress Notes (Signed)
Clinical Summary Chad Blair is a 66 y.o.male seen today for follow up of the following medical problems.  1. CAD  - prior CABG approx 16 years ago at [facility]   - uses treadmill daily for 30 minutes, no significant symptoms.   2. Hyperlipidemia  - compliant with meds - Jan 2017 TC 108 TG 129 HDL 32 LDL 50 - we had discussed stopping his fenofibrate last visit. Patient reports hispcp favored continuing.   3. HTN  - does not check at home  regularly - remains compliant with medications.  4. DM 2 - followed by endocrine.     SH: retired [employer]. Spends most of his time hunting and fishing, spending time with his 2 grandsons.    Past Medical History:  Diagnosis Date  . Coronary artery disease   . Diabetes mellitus   . GERD (gastroesophageal reflux disease)   . Hiatal hernia   . Hyperlipidemia   . PUD (peptic ulcer disease)      No Known Allergies   Current Outpatient Prescriptions  Medication Sig Dispense Refill  . ACCU-CHEK AVIVA PLUS test strip     . aspirin 81 MG tablet Take 81 mg by mouth daily.    Marland Kitchen atenolol (TENORMIN) 50 MG tablet Take 1 tablet (50 mg total) by mouth daily. 90 tablet 3  . atorvastatin (LIPITOR) 80 MG tablet Take 1 tablet (80 mg total) by mouth daily. 90 tablet 3  . esomeprazole (NEXIUM) 40 MG capsule Take 40 mg by mouth daily before breakfast. Reported on 03/14/2015    . fenofibrate (TRICOR) 145 MG tablet Take 1 tablet (145 mg total) by mouth daily. 90 tablet 2  . glimepiride (AMARYL) 1 MG tablet     . hydrochlorothiazide (MICROZIDE) 12.5 MG capsule Take 1 capsule (12.5 mg total) by mouth daily. 90 capsule 3  . Multiple Vitamins-Minerals (MULTI FOR HIM PO) Take 1 tablet by mouth daily.      . nabumetone (RELAFEN) 750 MG tablet Take 750 mg by mouth 2 (two) times daily as needed. For arthritis     . sitaGLIPtan-metformin (JANUMET) 50-1000 MG per tablet Take 1 tablet by mouth 2 (two) times daily with a meal.      .  trandolapril (MAVIK) 4 MG tablet Take 1 tablet (4 mg total) by mouth daily. 90 tablet 3   No current facility-administered medications for this visit.      Past Surgical History:  Procedure Laterality Date  . CHOLECYSTECTOMY    . COLONOSCOPY  11/21/2010   Procedure: COLONOSCOPY;  Surgeon: Rogene Houston, MD;  [facility];  Service: Endoscopy;  Laterality: N/A;  . CORONARY ARTERY BYPASS GRAFT     x4  . WRIST SURGERY     right     No Known Allergies    Family History  Problem Relation Age of Onset  . Coronary artery disease Father 58    died  . Coronary artery disease Mother 103     Social History Chad Blair reports that he has never smoked. He has never used smokeless tobacco. Chad Blair reports that he does not drink alcohol.   Review of Systems CONSTITUTIONAL: No weight loss, fever, chills, weakness or fatigue.  HEENT: Eyes: No visual loss, blurred vision, double vision or yellow sclerae.No hearing loss, sneezing, congestion, runny nose or sore throat.  SKIN: No rash or itching.  CARDIOVASCULAR: per hpi RESPIRATORY: No shortness of breath, cough or sputum.  GASTROINTESTINAL: No anorexia, nausea,  vomiting or diarrhea. No abdominal pain or blood.  GENITOURINARY: No burning on urination, no polyuria NEUROLOGICAL: No headache, dizziness, syncope, paralysis, ataxia, numbness or tingling in the extremities. No change in bowel or bladder control.  MUSCULOSKELETAL: No muscle, back pain, joint pain or stiffness.  LYMPHATICS: No enlarged nodes. No history of splenectomy.  PSYCHIATRIC: No history of depression or anxiety.  ENDOCRINOLOGIC: No reports of sweating, cold or heat intolerance. No polyuria or polydipsia.  Marland Kitchen   Physical Examination Vitals:   03/07/16 0850  BP: 126/72  Pulse: 77   Vitals:   03/07/16 0850  Weight: 179 lb (81.2 kg)  Height: 5\' 8"  (1.727 m)    Gen: resting comfortably, no acute distress HEENT: no scleral icterus, pupils equal round  and reactive, no palptable cervical adenopathy,  CV: RRR, no m/r/g, no jvd Resp: Clear to auscultation bilaterally GI: abdomen is soft, non-tender, non-distended, normal bowel sounds, no hepatosplenomegaly MSK: extremities are warm, no edema.  Skin: warm, no rash Neuro:  no focal deficits Psych: appropriate affect     Assessment and Plan  1. CAD  - no current symptoms  - we will continue current meds  2. HTN  - patient is at goal, continue current meds    3. Hyperlipidemia  - continue high dose statin.  - I have recommended again stopping his fenofibrate. Based on the 2016 FDA statement fenofibrate is no long recommended in patients on statin therapy due to lack of additional clinical benefits.    F/u 1 year. Order annual labs.      Arnoldo Lenis, M.D.

## 2016-04-10 ENCOUNTER — Other Ambulatory Visit: Payer: Self-pay | Admitting: Cardiology

## 2016-05-29 ENCOUNTER — Other Ambulatory Visit: Payer: Self-pay | Admitting: Cardiology

## 2016-05-30 DIAGNOSIS — H2513 Age-related nuclear cataract, bilateral: Secondary | ICD-10-CM | POA: Diagnosis not present

## 2016-05-30 DIAGNOSIS — H52203 Unspecified astigmatism, bilateral: Secondary | ICD-10-CM | POA: Diagnosis not present

## 2016-05-30 DIAGNOSIS — E119 Type 2 diabetes mellitus without complications: Secondary | ICD-10-CM | POA: Diagnosis not present

## 2016-06-24 DIAGNOSIS — E7800 Pure hypercholesterolemia, unspecified: Secondary | ICD-10-CM | POA: Diagnosis not present

## 2016-06-24 DIAGNOSIS — I10 Essential (primary) hypertension: Secondary | ICD-10-CM | POA: Diagnosis not present

## 2016-06-24 DIAGNOSIS — E1165 Type 2 diabetes mellitus with hyperglycemia: Secondary | ICD-10-CM | POA: Diagnosis not present

## 2016-07-05 DIAGNOSIS — Z0000 Encounter for general adult medical examination without abnormal findings: Secondary | ICD-10-CM | POA: Diagnosis not present

## 2016-07-05 DIAGNOSIS — E663 Overweight: Secondary | ICD-10-CM | POA: Diagnosis not present

## 2016-07-05 DIAGNOSIS — I10 Essential (primary) hypertension: Secondary | ICD-10-CM | POA: Diagnosis not present

## 2016-07-05 DIAGNOSIS — E119 Type 2 diabetes mellitus without complications: Secondary | ICD-10-CM | POA: Diagnosis not present

## 2016-07-05 DIAGNOSIS — Z23 Encounter for immunization: Secondary | ICD-10-CM | POA: Diagnosis not present

## 2016-07-05 DIAGNOSIS — I2510 Atherosclerotic heart disease of native coronary artery without angina pectoris: Secondary | ICD-10-CM | POA: Diagnosis not present

## 2016-07-05 DIAGNOSIS — K219 Gastro-esophageal reflux disease without esophagitis: Secondary | ICD-10-CM | POA: Diagnosis not present

## 2016-07-05 DIAGNOSIS — E782 Mixed hyperlipidemia: Secondary | ICD-10-CM | POA: Diagnosis not present

## 2016-07-05 DIAGNOSIS — Z6828 Body mass index (BMI) 28.0-28.9, adult: Secondary | ICD-10-CM | POA: Diagnosis not present

## 2016-07-05 DIAGNOSIS — Z1389 Encounter for screening for other disorder: Secondary | ICD-10-CM | POA: Diagnosis not present

## 2016-07-09 DIAGNOSIS — Z6828 Body mass index (BMI) 28.0-28.9, adult: Secondary | ICD-10-CM | POA: Diagnosis not present

## 2016-07-09 DIAGNOSIS — M5126 Other intervertebral disc displacement, lumbar region: Secondary | ICD-10-CM | POA: Diagnosis not present

## 2016-07-09 DIAGNOSIS — K219 Gastro-esophageal reflux disease without esophagitis: Secondary | ICD-10-CM | POA: Diagnosis not present

## 2016-07-09 DIAGNOSIS — E119 Type 2 diabetes mellitus without complications: Secondary | ICD-10-CM | POA: Diagnosis not present

## 2016-07-12 DIAGNOSIS — M5410 Radiculopathy, site unspecified: Secondary | ICD-10-CM | POA: Diagnosis not present

## 2016-07-12 DIAGNOSIS — Z6828 Body mass index (BMI) 28.0-28.9, adult: Secondary | ICD-10-CM | POA: Diagnosis not present

## 2016-07-12 DIAGNOSIS — E119 Type 2 diabetes mellitus without complications: Secondary | ICD-10-CM | POA: Diagnosis not present

## 2016-07-12 DIAGNOSIS — M5126 Other intervertebral disc displacement, lumbar region: Secondary | ICD-10-CM | POA: Diagnosis not present

## 2016-07-19 DIAGNOSIS — M48061 Spinal stenosis, lumbar region without neurogenic claudication: Secondary | ICD-10-CM | POA: Diagnosis not present

## 2016-07-19 DIAGNOSIS — M5416 Radiculopathy, lumbar region: Secondary | ICD-10-CM | POA: Diagnosis not present

## 2016-07-19 DIAGNOSIS — M545 Low back pain: Secondary | ICD-10-CM | POA: Diagnosis not present

## 2016-07-19 DIAGNOSIS — M5127 Other intervertebral disc displacement, lumbosacral region: Secondary | ICD-10-CM | POA: Diagnosis not present

## 2016-07-19 DIAGNOSIS — M9983 Other biomechanical lesions of lumbar region: Secondary | ICD-10-CM | POA: Diagnosis not present

## 2016-07-19 DIAGNOSIS — M4856XD Collapsed vertebra, not elsewhere classified, lumbar region, subsequent encounter for fracture with routine healing: Secondary | ICD-10-CM | POA: Diagnosis not present

## 2016-07-19 DIAGNOSIS — M5144 Schmorl's nodes, thoracic region: Secondary | ICD-10-CM | POA: Diagnosis not present

## 2016-07-24 DIAGNOSIS — M5442 Lumbago with sciatica, left side: Secondary | ICD-10-CM | POA: Diagnosis not present

## 2016-07-24 DIAGNOSIS — M9903 Segmental and somatic dysfunction of lumbar region: Secondary | ICD-10-CM | POA: Diagnosis not present

## 2016-07-25 DIAGNOSIS — M9903 Segmental and somatic dysfunction of lumbar region: Secondary | ICD-10-CM | POA: Diagnosis not present

## 2016-07-25 DIAGNOSIS — M5442 Lumbago with sciatica, left side: Secondary | ICD-10-CM | POA: Diagnosis not present

## 2016-07-26 DIAGNOSIS — M5442 Lumbago with sciatica, left side: Secondary | ICD-10-CM | POA: Diagnosis not present

## 2016-07-26 DIAGNOSIS — M9903 Segmental and somatic dysfunction of lumbar region: Secondary | ICD-10-CM | POA: Diagnosis not present

## 2016-07-29 DIAGNOSIS — M9903 Segmental and somatic dysfunction of lumbar region: Secondary | ICD-10-CM | POA: Diagnosis not present

## 2016-07-29 DIAGNOSIS — M5442 Lumbago with sciatica, left side: Secondary | ICD-10-CM | POA: Diagnosis not present

## 2016-07-31 DIAGNOSIS — M9903 Segmental and somatic dysfunction of lumbar region: Secondary | ICD-10-CM | POA: Diagnosis not present

## 2016-07-31 DIAGNOSIS — M5442 Lumbago with sciatica, left side: Secondary | ICD-10-CM | POA: Diagnosis not present

## 2016-08-01 DIAGNOSIS — M9903 Segmental and somatic dysfunction of lumbar region: Secondary | ICD-10-CM | POA: Diagnosis not present

## 2016-08-01 DIAGNOSIS — M5442 Lumbago with sciatica, left side: Secondary | ICD-10-CM | POA: Diagnosis not present

## 2016-08-07 DIAGNOSIS — Z6826 Body mass index (BMI) 26.0-26.9, adult: Secondary | ICD-10-CM | POA: Diagnosis not present

## 2016-08-07 DIAGNOSIS — I10 Essential (primary) hypertension: Secondary | ICD-10-CM | POA: Diagnosis not present

## 2016-08-07 DIAGNOSIS — M5126 Other intervertebral disc displacement, lumbar region: Secondary | ICD-10-CM | POA: Diagnosis not present

## 2016-08-08 DIAGNOSIS — M9903 Segmental and somatic dysfunction of lumbar region: Secondary | ICD-10-CM | POA: Diagnosis not present

## 2016-08-08 DIAGNOSIS — M5442 Lumbago with sciatica, left side: Secondary | ICD-10-CM | POA: Diagnosis not present

## 2016-08-12 DIAGNOSIS — M5442 Lumbago with sciatica, left side: Secondary | ICD-10-CM | POA: Diagnosis not present

## 2016-08-12 DIAGNOSIS — M9903 Segmental and somatic dysfunction of lumbar region: Secondary | ICD-10-CM | POA: Diagnosis not present

## 2016-08-15 DIAGNOSIS — M5442 Lumbago with sciatica, left side: Secondary | ICD-10-CM | POA: Diagnosis not present

## 2016-08-15 DIAGNOSIS — M9903 Segmental and somatic dysfunction of lumbar region: Secondary | ICD-10-CM | POA: Diagnosis not present

## 2016-08-19 DIAGNOSIS — M9903 Segmental and somatic dysfunction of lumbar region: Secondary | ICD-10-CM | POA: Diagnosis not present

## 2016-08-19 DIAGNOSIS — M5442 Lumbago with sciatica, left side: Secondary | ICD-10-CM | POA: Diagnosis not present

## 2016-08-22 DIAGNOSIS — M9903 Segmental and somatic dysfunction of lumbar region: Secondary | ICD-10-CM | POA: Diagnosis not present

## 2016-08-22 DIAGNOSIS — M5442 Lumbago with sciatica, left side: Secondary | ICD-10-CM | POA: Diagnosis not present

## 2016-08-29 DIAGNOSIS — M5442 Lumbago with sciatica, left side: Secondary | ICD-10-CM | POA: Diagnosis not present

## 2016-08-29 DIAGNOSIS — M9903 Segmental and somatic dysfunction of lumbar region: Secondary | ICD-10-CM | POA: Diagnosis not present

## 2016-09-04 DIAGNOSIS — M9903 Segmental and somatic dysfunction of lumbar region: Secondary | ICD-10-CM | POA: Diagnosis not present

## 2016-09-04 DIAGNOSIS — M5442 Lumbago with sciatica, left side: Secondary | ICD-10-CM | POA: Diagnosis not present

## 2016-09-09 DIAGNOSIS — M5442 Lumbago with sciatica, left side: Secondary | ICD-10-CM | POA: Diagnosis not present

## 2016-09-09 DIAGNOSIS — M9903 Segmental and somatic dysfunction of lumbar region: Secondary | ICD-10-CM | POA: Diagnosis not present

## 2016-09-16 DIAGNOSIS — M9903 Segmental and somatic dysfunction of lumbar region: Secondary | ICD-10-CM | POA: Diagnosis not present

## 2016-09-16 DIAGNOSIS — M5442 Lumbago with sciatica, left side: Secondary | ICD-10-CM | POA: Diagnosis not present

## 2016-09-23 DIAGNOSIS — M5442 Lumbago with sciatica, left side: Secondary | ICD-10-CM | POA: Diagnosis not present

## 2016-09-23 DIAGNOSIS — M9903 Segmental and somatic dysfunction of lumbar region: Secondary | ICD-10-CM | POA: Diagnosis not present

## 2016-09-30 DIAGNOSIS — M9903 Segmental and somatic dysfunction of lumbar region: Secondary | ICD-10-CM | POA: Diagnosis not present

## 2016-09-30 DIAGNOSIS — M5442 Lumbago with sciatica, left side: Secondary | ICD-10-CM | POA: Diagnosis not present

## 2016-10-14 DIAGNOSIS — M9903 Segmental and somatic dysfunction of lumbar region: Secondary | ICD-10-CM | POA: Diagnosis not present

## 2016-10-14 DIAGNOSIS — M5442 Lumbago with sciatica, left side: Secondary | ICD-10-CM | POA: Diagnosis not present

## 2016-10-21 DIAGNOSIS — M5442 Lumbago with sciatica, left side: Secondary | ICD-10-CM | POA: Diagnosis not present

## 2016-10-21 DIAGNOSIS — M9903 Segmental and somatic dysfunction of lumbar region: Secondary | ICD-10-CM | POA: Diagnosis not present

## 2016-11-04 DIAGNOSIS — M5442 Lumbago with sciatica, left side: Secondary | ICD-10-CM | POA: Diagnosis not present

## 2016-11-04 DIAGNOSIS — M9903 Segmental and somatic dysfunction of lumbar region: Secondary | ICD-10-CM | POA: Diagnosis not present

## 2016-11-11 DIAGNOSIS — M9903 Segmental and somatic dysfunction of lumbar region: Secondary | ICD-10-CM | POA: Diagnosis not present

## 2016-11-11 DIAGNOSIS — M5442 Lumbago with sciatica, left side: Secondary | ICD-10-CM | POA: Diagnosis not present

## 2016-11-21 DIAGNOSIS — L570 Actinic keratosis: Secondary | ICD-10-CM | POA: Diagnosis not present

## 2016-11-21 DIAGNOSIS — X32XXXD Exposure to sunlight, subsequent encounter: Secondary | ICD-10-CM | POA: Diagnosis not present

## 2016-11-21 DIAGNOSIS — L821 Other seborrheic keratosis: Secondary | ICD-10-CM | POA: Diagnosis not present

## 2016-11-21 DIAGNOSIS — L820 Inflamed seborrheic keratosis: Secondary | ICD-10-CM | POA: Diagnosis not present

## 2016-11-21 DIAGNOSIS — D225 Melanocytic nevi of trunk: Secondary | ICD-10-CM | POA: Diagnosis not present

## 2016-11-25 DIAGNOSIS — M9903 Segmental and somatic dysfunction of lumbar region: Secondary | ICD-10-CM | POA: Diagnosis not present

## 2016-11-25 DIAGNOSIS — M5442 Lumbago with sciatica, left side: Secondary | ICD-10-CM | POA: Diagnosis not present

## 2016-12-23 DIAGNOSIS — E1165 Type 2 diabetes mellitus with hyperglycemia: Secondary | ICD-10-CM | POA: Diagnosis not present

## 2016-12-23 DIAGNOSIS — Z23 Encounter for immunization: Secondary | ICD-10-CM | POA: Diagnosis not present

## 2016-12-23 DIAGNOSIS — E7800 Pure hypercholesterolemia, unspecified: Secondary | ICD-10-CM | POA: Diagnosis not present

## 2016-12-23 DIAGNOSIS — I10 Essential (primary) hypertension: Secondary | ICD-10-CM | POA: Diagnosis not present

## 2017-01-04 ENCOUNTER — Other Ambulatory Visit: Payer: Self-pay | Admitting: Cardiology

## 2017-01-07 DIAGNOSIS — K219 Gastro-esophageal reflux disease without esophagitis: Secondary | ICD-10-CM | POA: Diagnosis not present

## 2017-01-07 DIAGNOSIS — Z6827 Body mass index (BMI) 27.0-27.9, adult: Secondary | ICD-10-CM | POA: Diagnosis not present

## 2017-01-07 DIAGNOSIS — E663 Overweight: Secondary | ICD-10-CM | POA: Diagnosis not present

## 2017-01-07 DIAGNOSIS — J042 Acute laryngotracheitis: Secondary | ICD-10-CM | POA: Diagnosis not present

## 2017-01-07 DIAGNOSIS — J329 Chronic sinusitis, unspecified: Secondary | ICD-10-CM | POA: Diagnosis not present

## 2017-01-07 DIAGNOSIS — Z1389 Encounter for screening for other disorder: Secondary | ICD-10-CM | POA: Diagnosis not present

## 2017-01-07 DIAGNOSIS — E119 Type 2 diabetes mellitus without complications: Secondary | ICD-10-CM | POA: Diagnosis not present

## 2017-03-25 DIAGNOSIS — E1165 Type 2 diabetes mellitus with hyperglycemia: Secondary | ICD-10-CM | POA: Diagnosis not present

## 2017-03-25 DIAGNOSIS — I10 Essential (primary) hypertension: Secondary | ICD-10-CM | POA: Diagnosis not present

## 2017-03-25 DIAGNOSIS — E7800 Pure hypercholesterolemia, unspecified: Secondary | ICD-10-CM | POA: Diagnosis not present

## 2017-03-27 ENCOUNTER — Ambulatory Visit (INDEPENDENT_AMBULATORY_CARE_PROVIDER_SITE_OTHER): Payer: Medicare Other | Admitting: Cardiology

## 2017-03-27 ENCOUNTER — Encounter: Payer: Self-pay | Admitting: Cardiology

## 2017-03-27 VITALS — BP 116/70 | HR 77 | Ht 69.0 in | Wt 175.0 lb

## 2017-03-27 DIAGNOSIS — Z79899 Other long term (current) drug therapy: Secondary | ICD-10-CM | POA: Diagnosis not present

## 2017-03-27 DIAGNOSIS — E782 Mixed hyperlipidemia: Secondary | ICD-10-CM

## 2017-03-27 DIAGNOSIS — I10 Essential (primary) hypertension: Secondary | ICD-10-CM | POA: Diagnosis not present

## 2017-03-27 DIAGNOSIS — I2510 Atherosclerotic heart disease of native coronary artery without angina pectoris: Secondary | ICD-10-CM

## 2017-03-27 MED ORDER — HYDROCHLOROTHIAZIDE 12.5 MG PO CAPS: 12.5000 mg | ORAL_CAPSULE | Freq: Every day | ORAL | 3 refills | Status: DC

## 2017-03-27 MED ORDER — ATENOLOL 50 MG PO TABS: 50.0000 mg | ORAL_TABLET | Freq: Every day | ORAL | 3 refills | Status: DC

## 2017-03-27 NOTE — Progress Notes (Signed)
Clinical Summary Mr. Mccaslin is a 67 y.o.male seen today for follow up of the following medical problems.  1. CAD  - prior CABG approx 16 years ago at [facility]   - no recent chest pain. Running on treadmill 30 min per day - compliant with meds   2. Hyperlipidemia  - compliant with meds - we had discussed stopping his fenofibrate last visit. - he has favored staying on fenofibrate.  3. HTN  - compliant with meds  4. DM 2 - followed by endocrine.  - compliant with meds   SH: retired [employer]. Spends most of his time hunting and fishing, spending time with his 2 grandsons.    Past Medical History:  Diagnosis Date  . Coronary artery disease   . Diabetes mellitus   . GERD (gastroesophageal reflux disease)   . Hiatal hernia   . Hyperlipidemia   . PUD (peptic ulcer disease)      No Known Allergies   Current Outpatient Medications  Medication Sig Dispense Refill  . ACCU-CHEK AVIVA PLUS test strip     . aspirin 81 MG tablet Take 81 mg by mouth daily.    Marland Kitchen atenolol (TENORMIN) 50 MG tablet TAKE 1 TABLET DAILY 90 tablet 3  . atorvastatin (LIPITOR) 80 MG tablet TAKE 1 TABLET DAILY 90 tablet 3  . esomeprazole (NEXIUM) 40 MG capsule Take 40 mg by mouth daily before breakfast. Reported on 03/14/2015    . fenofibrate (TRICOR) 145 MG tablet Take 1 tablet (145 mg total) by mouth daily. 90 tablet 2  . fenofibrate (TRICOR) 145 MG tablet TAKE 1 TABLET DAILY 90 tablet 2  . glimepiride (AMARYL) 2 MG tablet     . hydrochlorothiazide (MICROZIDE) 12.5 MG capsule Take 1 capsule (12.5 mg total) by mouth daily. 90 capsule 3  . Multiple Vitamins-Minerals (MULTI FOR HIM PO) Take 1 tablet by mouth daily.      . nabumetone (RELAFEN) 750 MG tablet Take 750 mg by mouth 2 (two) times daily as needed. For arthritis     . omeprazole (PRILOSEC) 40 MG capsule     . sitaGLIPtan-metformin (JANUMET) 50-1000 MG per tablet Take 1 tablet by mouth 2 (two) times daily with a meal.        . trandolapril (MAVIK) 4 MG tablet Take 1 tablet (4 mg total) by mouth daily. 90 tablet 3   No current facility-administered medications for this visit.      Past Surgical History:  Procedure Laterality Date  . CHOLECYSTECTOMY    . COLONOSCOPY  11/21/2010   Procedure: COLONOSCOPY;  Surgeon: Rogene Houston, MD;  [facility];  Service: Endoscopy;  Laterality: N/A;  . CORONARY ARTERY BYPASS GRAFT     x4  . WRIST SURGERY     right     No Known Allergies    Family History  Problem Relation Age of Onset  . Coronary artery disease Father 24       died  . Coronary artery disease Mother 19     Social History Mr. Hestand reports that  has never smoked. he has never used smokeless tobacco. Mr. Beyene reports that he does not drink alcohol.   Review of Systems CONSTITUTIONAL: No weight loss, fever, chills, weakness or fatigue.  HEENT: Eyes: No visual loss, blurred vision, double vision or yellow sclerae.No hearing loss, sneezing, congestion, runny nose or sore throat.  SKIN: No rash or itching.  CARDIOVASCULAR: per hpi RESPIRATORY: No shortness of breath,  cough or sputum.  GASTROINTESTINAL: No anorexia, nausea, vomiting or diarrhea. No abdominal pain or blood.  GENITOURINARY: No burning on urination, no polyuria NEUROLOGICAL: No headache, dizziness, syncope, paralysis, ataxia, numbness or tingling in the extremities. No change in bowel or bladder control.  MUSCULOSKELETAL: No muscle, back pain, joint pain or stiffness.  LYMPHATICS: No enlarged nodes. No history of splenectomy.  PSYCHIATRIC: No history of depression or anxiety.  ENDOCRINOLOGIC: No reports of sweating, cold or heat intolerance. No polyuria or polydipsia.  Marland Kitchen   Physical Examination Vitals:   03/27/17 1523  BP: 116/70  Pulse: 77  SpO2: 96%   Vitals:   03/27/17 1523  Weight: 175 lb (79.4 kg)  Height: 5\' 9"  (1.753 m)    Gen: resting comfortably, no acute distress HEENT: no scleral icterus,  pupils equal round and reactive, no palptable cervical adenopathy,  CV: RRR, no m/r/,g no jvd Resp: Clear to auscultation bilaterally GI: abdomen is soft, non-tender, non-distended, normal bowel sounds, no hepatosplenomegaly MSK: extremities are warm, no edema.  Skin: warm, no rash Neuro:  no focal deficits Psych: appropriate affect   Diagnostic Studies     Assessment and Plan  1. CAD  - no symptoms, continue current meds - EKG in clinic shows SR, no ischemic changes.   2. HTN  - bp at goal, continue current meds    3. Hyperlipidemia  - continue statin, he prefers to continue fenofibrate despite our discussion about recent guidelines - repeal lipid panel   Order annual labs. F/u 1 year      Arnoldo Lenis, M.D.

## 2017-03-27 NOTE — Patient Instructions (Signed)
Medication Instructions:  Your physician recommends that you continue on your current medications as directed. Please refer to the Current Medication list given to you today.   Labwork: CMET, CBC, MAGNESIUM, LIPIDS, TSH, PSA   Testing/Procedures: NONE  Follow-Up: Your physician wants you to follow-up in: 6 MONTHS .  You will receive a reminder letter in the mail two months in advance. If you don't receive a letter, please call our office to schedule the follow-up appointment.   Any Other Special Instructions Will Be Listed Below (If Applicable).     If you need a refill on your cardiac medications before your next appointment, please call your pharmacy.

## 2017-03-28 DIAGNOSIS — I10 Essential (primary) hypertension: Secondary | ICD-10-CM | POA: Diagnosis not present

## 2017-03-28 DIAGNOSIS — Z79899 Other long term (current) drug therapy: Secondary | ICD-10-CM | POA: Diagnosis not present

## 2017-03-28 LAB — COMPREHENSIVE METABOLIC PANEL
AG RATIO: 2.2 (calc) (ref 1.0–2.5)
ALBUMIN MSPROF: 4.4 g/dL (ref 3.6–5.1)
ALT: 58 U/L — AB (ref 9–46)
AST: 33 U/L (ref 10–35)
Alkaline phosphatase (APISO): 62 U/L (ref 40–115)
BUN: 24 mg/dL (ref 7–25)
CO2: 28 mmol/L (ref 20–32)
Calcium: 9.9 mg/dL (ref 8.6–10.3)
Chloride: 98 mmol/L (ref 98–110)
Creat: 1.07 mg/dL (ref 0.70–1.25)
Globulin: 2 g/dL (calc) (ref 1.9–3.7)
Glucose, Bld: 150 mg/dL — ABNORMAL HIGH (ref 65–99)
Potassium: 4.9 mmol/L (ref 3.5–5.3)
SODIUM: 134 mmol/L — AB (ref 135–146)
TOTAL PROTEIN: 6.4 g/dL (ref 6.1–8.1)
Total Bilirubin: 0.5 mg/dL (ref 0.2–1.2)

## 2017-03-28 LAB — CBC WITH DIFFERENTIAL/PLATELET
BASOS ABS: 49 {cells}/uL (ref 0–200)
Basophils Relative: 0.9 %
EOS PCT: 2.8 %
Eosinophils Absolute: 151 cells/uL (ref 15–500)
HCT: 47.5 % (ref 38.5–50.0)
HEMOGLOBIN: 16.2 g/dL (ref 13.2–17.1)
Lymphs Abs: 961 cells/uL (ref 850–3900)
MCH: 29.1 pg (ref 27.0–33.0)
MCHC: 34.1 g/dL (ref 32.0–36.0)
MCV: 85.4 fL (ref 80.0–100.0)
MONOS PCT: 12.6 %
MPV: 11.4 fL (ref 7.5–12.5)
NEUTROS ABS: 3559 {cells}/uL (ref 1500–7800)
Neutrophils Relative %: 65.9 %
Platelets: 273 10*3/uL (ref 140–400)
RBC: 5.56 10*6/uL (ref 4.20–5.80)
RDW: 13.1 % (ref 11.0–15.0)
Total Lymphocyte: 17.8 %
WBC mixed population: 680 cells/uL (ref 200–950)
WBC: 5.4 10*3/uL (ref 3.8–10.8)

## 2017-03-28 LAB — LIPID PANEL
CHOL/HDL RATIO: 4.3 (calc) (ref <5.0)
Cholesterol: 129 mg/dL (ref <200)
HDL: 30 mg/dL — AB (ref >40)
LDL Cholesterol (Calc): 73 mg/dL (calc)
NON-HDL CHOLESTEROL (CALC): 99 mg/dL (ref <130)
TRIGLYCERIDES: 179 mg/dL — AB (ref <150)

## 2017-03-28 LAB — PSA: PSA: 0.4 ng/mL (ref <=4.0)

## 2017-03-28 LAB — MAGNESIUM: Magnesium: 2.1 mg/dL (ref 1.5–2.5)

## 2017-03-28 LAB — TSH: TSH: 1.66 mIU/L (ref 0.40–4.50)

## 2017-03-30 ENCOUNTER — Encounter: Payer: Self-pay | Admitting: Cardiology

## 2017-05-22 DIAGNOSIS — L570 Actinic keratosis: Secondary | ICD-10-CM | POA: Diagnosis not present

## 2017-05-22 DIAGNOSIS — X32XXXD Exposure to sunlight, subsequent encounter: Secondary | ICD-10-CM | POA: Diagnosis not present

## 2017-05-22 DIAGNOSIS — L821 Other seborrheic keratosis: Secondary | ICD-10-CM | POA: Diagnosis not present

## 2017-05-22 DIAGNOSIS — L308 Other specified dermatitis: Secondary | ICD-10-CM | POA: Diagnosis not present

## 2017-06-02 DIAGNOSIS — H2513 Age-related nuclear cataract, bilateral: Secondary | ICD-10-CM | POA: Diagnosis not present

## 2017-06-02 DIAGNOSIS — H52203 Unspecified astigmatism, bilateral: Secondary | ICD-10-CM | POA: Diagnosis not present

## 2017-06-02 DIAGNOSIS — H25013 Cortical age-related cataract, bilateral: Secondary | ICD-10-CM | POA: Diagnosis not present

## 2017-06-02 DIAGNOSIS — E119 Type 2 diabetes mellitus without complications: Secondary | ICD-10-CM | POA: Diagnosis not present

## 2017-07-01 DIAGNOSIS — M9903 Segmental and somatic dysfunction of lumbar region: Secondary | ICD-10-CM | POA: Diagnosis not present

## 2017-07-01 DIAGNOSIS — M545 Low back pain: Secondary | ICD-10-CM | POA: Diagnosis not present

## 2017-07-03 DIAGNOSIS — M9903 Segmental and somatic dysfunction of lumbar region: Secondary | ICD-10-CM | POA: Diagnosis not present

## 2017-07-03 DIAGNOSIS — M545 Low back pain: Secondary | ICD-10-CM | POA: Diagnosis not present

## 2017-07-12 ENCOUNTER — Other Ambulatory Visit: Payer: Self-pay | Admitting: Cardiology

## 2017-07-16 DIAGNOSIS — Z6827 Body mass index (BMI) 27.0-27.9, adult: Secondary | ICD-10-CM | POA: Diagnosis not present

## 2017-07-16 DIAGNOSIS — E663 Overweight: Secondary | ICD-10-CM | POA: Diagnosis not present

## 2017-07-16 DIAGNOSIS — Z1389 Encounter for screening for other disorder: Secondary | ICD-10-CM | POA: Diagnosis not present

## 2017-07-16 DIAGNOSIS — Z0000 Encounter for general adult medical examination without abnormal findings: Secondary | ICD-10-CM | POA: Diagnosis not present

## 2017-07-22 DIAGNOSIS — E1165 Type 2 diabetes mellitus with hyperglycemia: Secondary | ICD-10-CM | POA: Diagnosis not present

## 2017-07-22 DIAGNOSIS — E7800 Pure hypercholesterolemia, unspecified: Secondary | ICD-10-CM | POA: Diagnosis not present

## 2017-07-28 DIAGNOSIS — E1165 Type 2 diabetes mellitus with hyperglycemia: Secondary | ICD-10-CM | POA: Diagnosis not present

## 2017-07-28 DIAGNOSIS — I10 Essential (primary) hypertension: Secondary | ICD-10-CM | POA: Diagnosis not present

## 2017-07-28 DIAGNOSIS — E7800 Pure hypercholesterolemia, unspecified: Secondary | ICD-10-CM | POA: Diagnosis not present

## 2017-07-30 DIAGNOSIS — M9903 Segmental and somatic dysfunction of lumbar region: Secondary | ICD-10-CM | POA: Diagnosis not present

## 2017-07-30 DIAGNOSIS — M545 Low back pain: Secondary | ICD-10-CM | POA: Diagnosis not present

## 2017-08-06 DIAGNOSIS — M545 Low back pain: Secondary | ICD-10-CM | POA: Diagnosis not present

## 2017-08-06 DIAGNOSIS — M9903 Segmental and somatic dysfunction of lumbar region: Secondary | ICD-10-CM | POA: Diagnosis not present

## 2017-08-11 ENCOUNTER — Other Ambulatory Visit: Payer: Self-pay | Admitting: Cardiology

## 2017-08-24 ENCOUNTER — Other Ambulatory Visit: Payer: Self-pay | Admitting: Cardiology

## 2017-10-31 ENCOUNTER — Encounter (INDEPENDENT_AMBULATORY_CARE_PROVIDER_SITE_OTHER): Payer: Self-pay | Admitting: *Deleted

## 2017-11-10 DIAGNOSIS — Z23 Encounter for immunization: Secondary | ICD-10-CM | POA: Diagnosis not present

## 2017-11-20 DIAGNOSIS — D0321 Melanoma in situ of right ear and external auricular canal: Secondary | ICD-10-CM | POA: Diagnosis not present

## 2017-11-20 DIAGNOSIS — L570 Actinic keratosis: Secondary | ICD-10-CM | POA: Diagnosis not present

## 2017-11-20 DIAGNOSIS — X32XXXD Exposure to sunlight, subsequent encounter: Secondary | ICD-10-CM | POA: Diagnosis not present

## 2017-12-04 DIAGNOSIS — D225 Melanocytic nevi of trunk: Secondary | ICD-10-CM | POA: Diagnosis not present

## 2017-12-04 DIAGNOSIS — D2271 Melanocytic nevi of right lower limb, including hip: Secondary | ICD-10-CM | POA: Diagnosis not present

## 2017-12-04 DIAGNOSIS — Z85820 Personal history of malignant melanoma of skin: Secondary | ICD-10-CM | POA: Diagnosis not present

## 2017-12-04 DIAGNOSIS — L821 Other seborrheic keratosis: Secondary | ICD-10-CM | POA: Diagnosis not present

## 2017-12-04 DIAGNOSIS — Z08 Encounter for follow-up examination after completed treatment for malignant neoplasm: Secondary | ICD-10-CM | POA: Diagnosis not present

## 2017-12-04 DIAGNOSIS — D485 Neoplasm of uncertain behavior of skin: Secondary | ICD-10-CM | POA: Diagnosis not present

## 2018-01-04 ENCOUNTER — Other Ambulatory Visit: Payer: Self-pay | Admitting: Cardiology

## 2018-01-06 DIAGNOSIS — L989 Disorder of the skin and subcutaneous tissue, unspecified: Secondary | ICD-10-CM | POA: Diagnosis not present

## 2018-01-06 DIAGNOSIS — D0321 Melanoma in situ of right ear and external auricular canal: Secondary | ICD-10-CM | POA: Diagnosis not present

## 2018-01-06 DIAGNOSIS — L905 Scar conditions and fibrosis of skin: Secondary | ICD-10-CM | POA: Diagnosis not present

## 2018-01-06 HISTORY — PX: MOHS SURGERY: SUR867

## 2018-01-26 DIAGNOSIS — E7800 Pure hypercholesterolemia, unspecified: Secondary | ICD-10-CM | POA: Diagnosis not present

## 2018-01-26 DIAGNOSIS — I10 Essential (primary) hypertension: Secondary | ICD-10-CM | POA: Diagnosis not present

## 2018-01-26 DIAGNOSIS — E1165 Type 2 diabetes mellitus with hyperglycemia: Secondary | ICD-10-CM | POA: Diagnosis not present

## 2018-03-02 ENCOUNTER — Encounter (HOSPITAL_COMMUNITY): Payer: Self-pay | Admitting: *Deleted

## 2018-03-02 ENCOUNTER — Other Ambulatory Visit: Payer: Self-pay

## 2018-03-02 ENCOUNTER — Emergency Department (HOSPITAL_COMMUNITY)
Admission: EM | Admit: 2018-03-02 | Discharge: 2018-03-02 | Disposition: A | Discharge status: Home / Self Care | Payer: Medicare Other | Attending: Emergency Medicine | Admitting: Emergency Medicine

## 2018-03-02 DIAGNOSIS — Y929 Unspecified place or not applicable: Secondary | ICD-10-CM | POA: Insufficient documentation

## 2018-03-02 DIAGNOSIS — I10 Essential (primary) hypertension: Secondary | ICD-10-CM | POA: Insufficient documentation

## 2018-03-02 DIAGNOSIS — X58XXXA Exposure to other specified factors, initial encounter: Secondary | ICD-10-CM | POA: Insufficient documentation

## 2018-03-02 DIAGNOSIS — Y999 Unspecified external cause status: Secondary | ICD-10-CM | POA: Insufficient documentation

## 2018-03-02 DIAGNOSIS — Z7984 Long term (current) use of oral hypoglycemic drugs: Secondary | ICD-10-CM | POA: Insufficient documentation

## 2018-03-02 DIAGNOSIS — E119 Type 2 diabetes mellitus without complications: Secondary | ICD-10-CM | POA: Insufficient documentation

## 2018-03-02 DIAGNOSIS — Z79899 Other long term (current) drug therapy: Secondary | ICD-10-CM | POA: Diagnosis not present

## 2018-03-02 DIAGNOSIS — T189XXA Foreign body of alimentary tract, part unspecified, initial encounter: Secondary | ICD-10-CM | POA: Diagnosis not present

## 2018-03-02 DIAGNOSIS — I2510 Atherosclerotic heart disease of native coronary artery without angina pectoris: Secondary | ICD-10-CM | POA: Diagnosis not present

## 2018-03-02 DIAGNOSIS — Z7982 Long term (current) use of aspirin: Secondary | ICD-10-CM | POA: Diagnosis not present

## 2018-03-02 DIAGNOSIS — Y9389 Activity, other specified: Secondary | ICD-10-CM | POA: Insufficient documentation

## 2018-03-02 NOTE — ED Triage Notes (Signed)
Pt states that he was eating and swallowed a toothpick, reports that he was not aware of the toothpick being in the food and felt a prick in his throat, is able to maintain salvia with  No problems,

## 2018-03-02 NOTE — Discharge Instructions (Addendum)
Watch for signs of problems from swallowing a toothpick, including, pain, spitting up blood, inability to swallow or vomiting.  Return here if needed.  Call the GI doctor, tomorrow, if you feel like you are not eating and drinking normally.

## 2018-03-02 NOTE — ED Provider Notes (Signed)
[facility] EMERGENCY DEPARTMENT Provider Note   CSN: [id number] Arrival date & time: 03/02/18  2003     History   Chief Complaint Chief Complaint  Patient presents with  . Swallowed Foreign Body    HPI Chad Blair is a 68 y.o. male.  HPI   He is eating food react with bacon, held on by a toothpick when he felt something sharp in his throat.  He has a persistent sensation of something stuck in the area near his suprasternal notch.  Since then he has been able to drink fluids without vomiting, or gagging.  He has not tried solid foods.  No prior similar problem.  No previous esophageal or stomach disorder known to him.  There are no other known modifying factors.  Past Medical History:  Diagnosis Date  . Coronary artery disease   . Diabetes mellitus   . GERD (gastroesophageal reflux disease)   . Hiatal hernia   . Hyperlipidemia   . PUD (peptic ulcer disease)     Patient Active Problem List   Diagnosis Date Noted  . Coronary artery disease 11/27/2010  . HYPERTENSION, BENIGN 12/16/2008  . DIABETES MELLITUS, TYPE II 11/21/2008  . HYPERLIPIDEMIA-MIXED 11/21/2008  . GERD 11/21/2008  . HIATAL HERNIA 11/21/2008  . PUD, HX OF 11/21/2008    Past Surgical History:  Procedure Laterality Date  . CHOLECYSTECTOMY    . COLONOSCOPY  11/21/2010   Procedure: COLONOSCOPY;  Surgeon: Rogene Houston, MD;  [facility];  Service: Endoscopy;  Laterality: N/A;  . CORONARY ARTERY BYPASS GRAFT     x4  . WRIST SURGERY     right        Home Medications    Prior to Admission medications   Medication Sig Start Date End Date Taking? Authorizing Provider  aspirin 81 MG tablet Take 81 mg by mouth daily.   Yes [provider]  atenolol (TENORMIN) 50 MG tablet TAKE 1 TABLET DAILY Patient taking differently: Take 50 mg by mouth daily.  01/05/18  Yes BranchAlphonse Guild, MD  atorvastatin (LIPITOR) 80 MG tablet TAKE 1 TABLET DAILY Patient taking differently: Take 80 mg  by mouth every evening.  08/11/17  Yes BranchAlphonse Guild, MD  dapagliflozin propanediol (FARXIGA) 5 MG TABS tablet Take 2.5 mg by mouth 2 (two) times daily.   Yes [provider]  fenofibrate (TRICOR) 145 MG tablet TAKE 1 TABLET DAILY Patient taking differently: Take 145 mg by mouth every morning.  08/25/17  Yes Branch, Alphonse Guild, MD  glimepiride (AMARYL) 2 MG tablet Take 2 mg by mouth 2 (two) times daily.  01/22/16  Yes [provider]  Multiple Vitamins-Minerals (MULTI FOR HIM PO) Take 1 tablet by mouth daily.     Yes [provider]  nabumetone (RELAFEN) 750 MG tablet Take 750 mg by mouth 2 (two) times daily. For arthritis    Yes [provider]  omeprazole (PRILOSEC) 40 MG capsule Take 40 mg by mouth every morning.  12/22/15  Yes [provider]  sitaGLIPtan-metformin (JANUMET) 50-1000 MG per tablet Take 1 tablet by mouth 2 (two) times daily with a meal.     Yes [provider]  trandolapril ([place]) 4 MG tablet TAKE 1 TABLET DAILY Patient taking differently: Take 4 mg by mouth every morning.  07/14/17  Yes Branch, Alphonse Guild, MD    Family History Family History  Problem Relation Age of Onset  . Coronary artery disease Father 57  died  . Coronary artery disease Mother 72    Social History Social History   Tobacco Use  . Smoking status: Never Smoker  . Smokeless tobacco: Never Used  Substance Use Topics  . Alcohol use: No    Alcohol/week: 0.0 standard drinks  . Drug use: No     Allergies   Patient has no known allergies.   Review of Systems Review of Systems  All other systems reviewed and are negative.    Physical Exam Updated Vital Signs BP (!) 145/74 (BP Location: Right Arm)   Pulse 84   Temp 98.3 F (36.8 C) (Oral)   Resp 16   Ht 5\' 7"  (1.702 m)   Wt 77.1 kg   SpO2 97%   BMI 26.63 kg/m   Physical Exam Vitals signs and nursing note reviewed.  Constitutional:      Appearance: He is well-developed.   HENT:     Head: Normocephalic and atraumatic.     Right Ear: External ear normal.     Left Ear: External ear normal.     Nose: No congestion or rhinorrhea.     Mouth/Throat:     Mouth: Mucous membranes are moist.     Pharynx: Oropharynx is clear.     Comments: No visible foreign body in mouth, oropharynx.  Examined using tongue blade, good exam, without gagging.  No discomfort or asymmetry during exam.  No trismus.  He is controlling his oral secretions, by swallowing. Eyes:     Conjunctiva/sclera: Conjunctivae normal.     Pupils: Pupils are equal, round, and reactive to light.  Neck:     Musculoskeletal: Normal range of motion and neck supple.     Trachea: Phonation normal.  Cardiovascular:     Rate and Rhythm: Normal rate.     Heart sounds: Normal heart sounds.  Pulmonary:     Effort: Pulmonary effort is normal.  Musculoskeletal: Normal range of motion.  Skin:    General: Skin is warm and dry.  Neurological:     Mental Status: He is alert and oriented to person, place, and time.     Cranial Nerves: No cranial nerve deficit.     Sensory: No sensory deficit.     Motor: No abnormal muscle tone.     Coordination: Coordination normal.  Psychiatric:        Behavior: Behavior normal.        Thought Content: Thought content normal.        Judgment: Judgment normal.      ED Treatments / Results  Labs (all labs ordered are listed, but only abnormal results are displayed) Labs Reviewed - No data to display  EKG None  Radiology No results found.  Procedures Procedures (including critical care time)  Medications Ordered in ED Medications - No data to display   Initial Impression / Assessment and Plan / ED Course  I have reviewed the triage vital signs and the nursing notes.  Pertinent labs & imaging results that were available during my care of the patient were reviewed by me and considered in my medical decision making (see chart for details).      Patient  Vitals for the past 24 hrs:  BP Temp Temp src Pulse Resp SpO2 Height Weight  03/02/18 2049 (!) 145/74 - - 84 16 97 % - -  03/02/18 2010 (!) 149/91 98.3 F (36.8 C) Oral 79 16 96 % 5\' 7"  (1.702 m) 77.1 kg    8:53 PM Reevaluation with  update and discussion. After initial assessment and treatment, an updated evaluation reveals he was able to eat 3 packs of graham crackers, and drink liquids without discomfort or vomiting.  Findings discussed with patient and wife, all questions answered. Daleen Bo   Medical Decision Making: Possible esophageal injury from swallowed foreign body.  Doubt obstructing foreign body or significant esophageal injury.  No indication for endoscopic evaluation at this time.  CRITICAL CARE-no Performed by: Daleen Bo  Nursing Notes Reviewed/ Care Coordinated Applicable Imaging Reviewed Interpretation of Laboratory Data incorporated into ED treatment  The patient appears reasonably screened and/or stabilized for discharge and I doubt any other medical condition or other [facility] requiring further screening, evaluation, or treatment in the ED at this time prior to discharge.  Plan: Home Medications-continue usual medication; Home Treatments-gradually advance diet; return here if the recommended treatment, does not improve the symptoms; Recommended follow up-return here if needed.  Contact GI if symptoms persist.    Final Clinical Impressions(s) / ED Diagnoses   Final diagnoses:  Swallowed foreign body, initial encounter    ED Discharge Orders    None       Daleen Bo, MD 03/02/18 2054

## 2018-03-03 DIAGNOSIS — R070 Pain in throat: Secondary | ICD-10-CM | POA: Diagnosis not present

## 2018-03-05 ENCOUNTER — Encounter (HOSPITAL_COMMUNITY): Payer: Self-pay | Admitting: Emergency Medicine

## 2018-03-05 ENCOUNTER — Other Ambulatory Visit (HOSPITAL_COMMUNITY): Payer: Self-pay | Admitting: Internal Medicine

## 2018-03-05 ENCOUNTER — Other Ambulatory Visit: Payer: Self-pay | Admitting: Otolaryngology

## 2018-03-05 ENCOUNTER — Other Ambulatory Visit: Payer: Self-pay | Admitting: Internal Medicine

## 2018-03-05 ENCOUNTER — Other Ambulatory Visit: Payer: Self-pay

## 2018-03-05 ENCOUNTER — Inpatient Hospital Stay (HOSPITAL_COMMUNITY)
Admission: EM | Admit: 2018-03-05 | Discharge: 2018-03-07 | DRG: 392 | Disposition: A | Discharge status: Home / Self Care | Payer: Medicare Other | Attending: Family Medicine | Admitting: Family Medicine

## 2018-03-05 ENCOUNTER — Emergency Department (HOSPITAL_COMMUNITY): Payer: Medicare Other

## 2018-03-05 ENCOUNTER — Ambulatory Visit (HOSPITAL_COMMUNITY)
Admission: RE | Admit: 2018-03-05 | Discharge: 2018-03-05 | Disposition: A | Discharge status: Home / Self Care | Payer: Medicare Other | Source: Ambulatory Visit | Attending: Internal Medicine | Admitting: Internal Medicine

## 2018-03-05 DIAGNOSIS — K219 Gastro-esophageal reflux disease without esophagitis: Secondary | ICD-10-CM | POA: Diagnosis not present

## 2018-03-05 DIAGNOSIS — K208 Other esophagitis: Secondary | ICD-10-CM | POA: Diagnosis not present

## 2018-03-05 DIAGNOSIS — Z8249 Family history of ischemic heart disease and other diseases of the circulatory system: Secondary | ICD-10-CM | POA: Diagnosis not present

## 2018-03-05 DIAGNOSIS — I2583 Coronary atherosclerosis due to lipid rich plaque: Secondary | ICD-10-CM

## 2018-03-05 DIAGNOSIS — K223 Perforation of esophagus: Secondary | ICD-10-CM | POA: Diagnosis not present

## 2018-03-05 DIAGNOSIS — R1310 Dysphagia, unspecified: Secondary | ICD-10-CM

## 2018-03-05 DIAGNOSIS — Z7984 Long term (current) use of oral hypoglycemic drugs: Secondary | ICD-10-CM | POA: Diagnosis not present

## 2018-03-05 DIAGNOSIS — Z9049 Acquired absence of other specified parts of digestive tract: Secondary | ICD-10-CM

## 2018-03-05 DIAGNOSIS — Z8711 Personal history of peptic ulcer disease: Secondary | ICD-10-CM

## 2018-03-05 DIAGNOSIS — Z7982 Long term (current) use of aspirin: Secondary | ICD-10-CM

## 2018-03-05 DIAGNOSIS — E119 Type 2 diabetes mellitus without complications: Secondary | ICD-10-CM | POA: Diagnosis not present

## 2018-03-05 DIAGNOSIS — Z1389 Encounter for screening for other disorder: Secondary | ICD-10-CM | POA: Diagnosis not present

## 2018-03-05 DIAGNOSIS — E663 Overweight: Secondary | ICD-10-CM | POA: Diagnosis not present

## 2018-03-05 DIAGNOSIS — K449 Diaphragmatic hernia without obstruction or gangrene: Secondary | ICD-10-CM | POA: Diagnosis not present

## 2018-03-05 DIAGNOSIS — Z79899 Other long term (current) drug therapy: Secondary | ICD-10-CM | POA: Diagnosis not present

## 2018-03-05 DIAGNOSIS — E118 Type 2 diabetes mellitus with unspecified complications: Secondary | ICD-10-CM | POA: Diagnosis present

## 2018-03-05 DIAGNOSIS — Z951 Presence of aortocoronary bypass graft: Secondary | ICD-10-CM | POA: Diagnosis not present

## 2018-03-05 DIAGNOSIS — J390 Retropharyngeal and parapharyngeal abscess: Secondary | ICD-10-CM | POA: Diagnosis not present

## 2018-03-05 DIAGNOSIS — I2510 Atherosclerotic heart disease of native coronary artery without angina pectoris: Secondary | ICD-10-CM | POA: Diagnosis not present

## 2018-03-05 DIAGNOSIS — R498 Other voice and resonance disorders: Secondary | ICD-10-CM | POA: Diagnosis not present

## 2018-03-05 DIAGNOSIS — R509 Fever, unspecified: Secondary | ICD-10-CM | POA: Diagnosis not present

## 2018-03-05 DIAGNOSIS — Z6827 Body mass index (BMI) 27.0-27.9, adult: Secondary | ICD-10-CM | POA: Diagnosis not present

## 2018-03-05 DIAGNOSIS — S1124XA Puncture wound with foreign body of pharynx and cervical esophagus, initial encounter: Secondary | ICD-10-CM | POA: Diagnosis not present

## 2018-03-05 DIAGNOSIS — I10 Essential (primary) hypertension: Secondary | ICD-10-CM | POA: Diagnosis not present

## 2018-03-05 DIAGNOSIS — E782 Mixed hyperlipidemia: Secondary | ICD-10-CM | POA: Diagnosis present

## 2018-03-05 DIAGNOSIS — E785 Hyperlipidemia, unspecified: Secondary | ICD-10-CM | POA: Diagnosis present

## 2018-03-05 DIAGNOSIS — T189XXA Foreign body of alimentary tract, part unspecified, initial encounter: Secondary | ICD-10-CM | POA: Diagnosis not present

## 2018-03-05 HISTORY — DX: Type 2 diabetes mellitus without complications: E11.9

## 2018-03-05 HISTORY — DX: Unspecified osteoarthritis, unspecified site: M19.90

## 2018-03-05 HISTORY — DX: Cardiac murmur, unspecified: R01.1

## 2018-03-05 HISTORY — DX: Other chronic pain: G89.29

## 2018-03-05 HISTORY — DX: Dorsalgia, unspecified: M54.9

## 2018-03-05 HISTORY — DX: Unspecified malignant neoplasm of skin, unspecified: C44.90

## 2018-03-05 HISTORY — DX: Essential (primary) hypertension: I10

## 2018-03-05 LAB — CBC WITH DIFFERENTIAL/PLATELET
Abs Immature Granulocytes: 0.04 10*3/uL (ref 0.00–0.07)
Basophils Absolute: 0 10*3/uL (ref 0.0–0.1)
Basophils Relative: 0 %
EOS ABS: 0.1 10*3/uL (ref 0.0–0.5)
Eosinophils Relative: 0 %
HCT: 48.4 % (ref 39.0–52.0)
Hemoglobin: 16.3 g/dL (ref 13.0–17.0)
IMMATURE GRANULOCYTES: 0 %
Lymphocytes Relative: 9 %
Lymphs Abs: 1.1 10*3/uL (ref 0.7–4.0)
MCH: 29.7 pg (ref 26.0–34.0)
MCHC: 33.7 g/dL (ref 30.0–36.0)
MCV: 88.2 fL (ref 80.0–100.0)
MONOS PCT: 10 %
Monocytes Absolute: 1.4 10*3/uL — ABNORMAL HIGH (ref 0.1–1.0)
NEUTROS PCT: 81 %
Neutro Abs: 10.5 10*3/uL — ABNORMAL HIGH (ref 1.7–7.7)
Platelets: 256 10*3/uL (ref 150–400)
RBC: 5.49 MIL/uL (ref 4.22–5.81)
RDW: 13.7 % (ref 11.5–15.5)
WBC: 13.1 10*3/uL — AB (ref 4.0–10.5)
nRBC: 0 % (ref 0.0–0.2)

## 2018-03-05 LAB — COMPREHENSIVE METABOLIC PANEL
ALT: 36 U/L (ref 0–44)
AST: 19 U/L (ref 15–41)
Albumin: 4.4 g/dL (ref 3.5–5.0)
Alkaline Phosphatase: 64 U/L (ref 38–126)
Anion gap: 10 (ref 5–15)
BUN: 19 mg/dL (ref 8–23)
CHLORIDE: 101 mmol/L (ref 98–111)
CO2: 22 mmol/L (ref 22–32)
Calcium: 9.6 mg/dL (ref 8.9–10.3)
Creatinine, Ser: 0.9 mg/dL (ref 0.61–1.24)
Glucose, Bld: 134 mg/dL — ABNORMAL HIGH (ref 70–99)
POTASSIUM: 3.9 mmol/L (ref 3.5–5.1)
SODIUM: 133 mmol/L — AB (ref 135–145)
Total Bilirubin: 1.2 mg/dL (ref 0.3–1.2)
Total Protein: 7.8 g/dL (ref 6.5–8.1)

## 2018-03-05 LAB — GLUCOSE, CAPILLARY: GLUCOSE-CAPILLARY: 77 mg/dL (ref 70–99)

## 2018-03-05 LAB — I-STAT CG4 LACTIC ACID, ED: LACTIC ACID, VENOUS: 1.09 mmol/L (ref 0.5–1.9)

## 2018-03-05 MED ORDER — HYDRALAZINE HCL 20 MG/ML IJ SOLN: 5.0000 mg | INTRAMUSCULAR | Status: DC | PRN

## 2018-03-05 MED ORDER — IOHEXOL 300 MG/ML  SOLN
75.0000 mL | Freq: Once | INTRAMUSCULAR | Status: AC | PRN
  Administered 2018-03-05: 75 mL via INTRAVENOUS

## 2018-03-05 MED ORDER — CLINDAMYCIN PHOSPHATE 600 MG/50ML IV SOLN
600.0000 mg | Freq: Four times a day (QID) | INTRAVENOUS | Status: DC
  Administered 2018-03-05 – 2018-03-06 (×4): 600 mg via INTRAVENOUS
  Filled 2018-03-05 (×6): qty 50

## 2018-03-05 MED ORDER — SODIUM CHLORIDE 0.9 % IV SOLN
INTRAVENOUS | Status: DC
  Administered 2018-03-05 – 2018-03-06 (×3): via INTRAVENOUS

## 2018-03-05 MED ORDER — INSULIN ASPART 100 UNIT/ML ~~LOC~~ SOLN
0.0000 [IU] | Freq: Three times a day (TID) | SUBCUTANEOUS | Status: DC
  Administered 2018-03-06: 3 [IU] via SUBCUTANEOUS

## 2018-03-05 MED ORDER — CLINDAMYCIN PHOSPHATE 900 MG/50ML IV SOLN
900.0000 mg | Freq: Once | INTRAVENOUS | Status: AC
Start: 2018-03-05 — End: 2018-03-05
  Administered 2018-03-05: 900 mg via INTRAVENOUS
  Filled 2018-03-05: qty 50

## 2018-03-05 NOTE — ED Triage Notes (Signed)
Pt states that he swallowed a toothpick Monday night.

## 2018-03-05 NOTE — Progress Notes (Signed)
Pt stated that he is at [facility] waiting to be transferred to [facility] for admission.

## 2018-03-05 NOTE — H&P (Signed)
History and Physical    Chad Blair IPJ:[id number] DOB: September 01, 1950 DOA: 03/05/2018  I have briefly reviewed the patient's prior medical records in [place]  PCP: Sharilyn Sites, MD  Patient coming from: home  Chief Complaint: throat pain  HPI: Chad Blair is a 68 y.o. male with medical history significant of coronary artery disease, diabetes mellitus, hypertension, hyperlipidemia who comes to the hospital with chief complaint of throat pain.  Patient reports that on Monday evening while eating dinner he thinks he inadvertently swallowed a toothpick. He presented to the emergency room on 03/02/2018, he was evaluated, and he was able to eat and drink without difficulties and was discharged home.  He was evaluated by ENT yesterday, Dr. Benjamine Mola, as an outpatient and evaluation was without acute findings.  His foreign body sensation persistent in the neck and in fact getting a little bit worse, today patient saw his PCP and underwent a CT scan of the neck which showed "inflammatory edema, stranding and swelling within the retropharyngeal soft tissues throughout the neck. With this clinical history, this raises considerable concern regarding aero digestive tract injury from the swallowed tooth pick described by history. This appears to represent phlegmonous inflammation without a discretely defined abscess".  EDP discussed findings with Dr. Benjamine Mola who recommended antibiotics with clindamycin and transferred to [facility] for potentially surgery.  Patient today states that he has had some chills but no fevers, denies any chest pain, denies any shortness of breath, denies any nausea or vomiting.  ED Course: In the ED his vital signs are stable, BMP is unremarkable, lactic acid is normal, CBC shows a white count of 13.1.  CT scan as above, we are asked to admit and ENT will consult when patient gets to [place] [facility]: As per HPI otherwise 10 point review of systems negative.   Past  Medical History:  Diagnosis Date  . Coronary artery disease   . Diabetes mellitus   . GERD (gastroesophageal reflux disease)   . Hiatal hernia   . Hyperlipidemia   . PUD (peptic ulcer disease)     Past Surgical History:  Procedure Laterality Date  . CHOLECYSTECTOMY    . COLONOSCOPY  11/21/2010   Procedure: COLONOSCOPY;  Surgeon: Rogene Houston, MD;  [facility];  Service: Endoscopy;  Laterality: N/A;  . CORONARY ARTERY BYPASS GRAFT     x4  . WRIST SURGERY     right     reports that he has never smoked. He has never used smokeless tobacco. He reports that he does not drink alcohol or use drugs.  No Known Allergies  Family History  Problem Relation Age of Onset  . Coronary artery disease Father 12       died  . Coronary artery disease Mother 63    Prior to Admission medications   Medication Sig Start Date End Date Taking? Authorizing Provider  aspirin 81 MG tablet Take 81 mg by mouth daily.    [provider]  atenolol (TENORMIN) 50 MG tablet TAKE 1 TABLET DAILY Patient taking differently: Take 50 mg by mouth daily.  01/05/18   Arnoldo Lenis, MD  atorvastatin (LIPITOR) 80 MG tablet TAKE 1 TABLET DAILY Patient taking differently: Take 80 mg by mouth every evening.  08/11/17   Arnoldo Lenis, MD  dapagliflozin propanediol (FARXIGA) 5 MG TABS tablet Take 2.5 mg by mouth 2 (two) times daily.    [provider]  fenofibrate (  TRICOR) 145 MG tablet TAKE 1 TABLET DAILY Patient taking differently: Take 145 mg by mouth every morning.  08/25/17   Arnoldo Lenis, MD  glimepiride (AMARYL) 2 MG tablet Take 2 mg by mouth 2 (two) times daily.  01/22/16   [provider]  Multiple Vitamins-Minerals (MULTI FOR HIM PO) Take 1 tablet by mouth daily.      [provider]  nabumetone (RELAFEN) 750 MG tablet Take 750 mg by mouth 2 (two) times daily. For arthritis     [provider]  omeprazole (PRILOSEC) 40 MG capsule Take 40 mg  by mouth every morning.  12/22/15   [provider]  sitaGLIPtan-metformin (JANUMET) 50-1000 MG per tablet Take 1 tablet by mouth 2 (two) times daily with a meal.      [provider]  trandolapril ([place]) 4 MG tablet TAKE 1 TABLET DAILY Patient taking differently: Take 4 mg by mouth every morning.  07/14/17   Arnoldo Lenis, MD    Physical Exam: Vitals:   03/05/18 1303  BP: (!) 143/90  Pulse: 79  Resp: 20  Temp: 98 F (36.7 C)  SpO2: 99%    Constitutional: NAD, calm, comfortable Eyes: PERRL, lids and conjunctivae normal ENMT: Mucous membranes are moist. Posterior pharynx clear of any exudate or lesions. No obvious lesions in visible oropharynx Neck: normal, supple, no masses, no thyromegaly Respiratory: clear to auscultation bilaterally, no wheezing, no crackles. Normal respiratory effort. No accessory muscle use.  Cardiovascular: Regular rate and rhythm, no murmurs / rubs / gallops. No extremity edema. 2+ pedal pulses.  Abdomen: no tenderness, no masses palpated. Bowel sounds positive.  Musculoskeletal: no clubbing / cyanosis. Normal muscle tone.  Skin: no rashes, lesions, ulcers. No induration Neurologic: CN 2-12 grossly intact. Strength 5/5 in all 4.  Psychiatric: Normal judgment and insight. Alert and oriented x 3. Normal mood.   Labs on Admission: I have personally reviewed following labs and imaging studies  CBC: Recent Labs  Lab 03/05/18 1327  WBC 13.1*  NEUTROABS 10.5*  HGB 16.3  HCT 48.4  MCV 88.2  PLT 585   Basic Metabolic Panel: Recent Labs  Lab 03/05/18 1327  NA 133*  K 3.9  CL 101  CO2 22  GLUCOSE 134*  BUN 19  CREATININE 0.90  CALCIUM 9.6   GFR: Estimated Creatinine Clearance: 74.5 mL/min (by C-G formula based on SCr of 0.9 mg/dL). Liver Function Tests: Recent Labs  Lab 03/05/18 1327  AST 19  ALT 36  ALKPHOS 64  BILITOT 1.2  PROT 7.8  ALBUMIN 4.4   No results for input(s): LIPASE, AMYLASE in the last 168  hours. No results for input(s): AMMONIA in the last 168 hours. Coagulation Profile: No results for input(s): INR, PROTIME in the last 168 hours. Cardiac Enzymes: No results for input(s): CKTOTAL, CKMB, CKMBINDEX, TROPONINI in the last 168 hours. BNP (last 3 results) No results for input(s): PROBNP in the last 8760 hours. HbA1C: No results for input(s): HGBA1C in the last 72 hours. CBG: No results for input(s): GLUCAP in the last 168 hours. Lipid Profile: No results for input(s): CHOL, HDL, LDLCALC, TRIG, CHOLHDL, LDLDIRECT in the last 72 hours. Thyroid Function Tests: No results for input(s): TSH, T4TOTAL, FREET4, T3FREE, THYROIDAB in the last 72 hours. Anemia Panel: No results for input(s): VITAMINB12, FOLATE, FERRITIN, TIBC, IRON, RETICCTPCT in the last 72 hours. Urine analysis: No results found for: COLORURINE, APPEARANCEUR, [place], [place], [place], [place], [place], [place], [place], [place], [place], [place]  Exams on Admission: Ct Soft Tissue Neck W Contrast  Result Date: 03/05/2018 CLINICAL DATA:  Patient swallowed tooth pick 3 days ago. Throat pain. Negative endoscopy exam. EXAM: CT NECK WITH CONTRAST TECHNIQUE: Multidetector CT imaging of the neck was performed using the standard protocol following the bolus administration of intravenous contrast. CONTRAST:  14mL OMNIPAQUE IOHEXOL 300 MG/ML  SOLN COMPARISON:  None. FINDINGS: Pharynx and larynx: The patient shows evidence of inflammatory edema and stranding within the retropharyngeal soft tissues from the region of C2 to T1. Given the clinical history, this raises considerable concern regarding aero digestive tract injury from the swallowed tooth pick with regional inflammation. This appears to be phlegmonous inflammation at this time without a clearly defined low-density abscess. I am unable to clearly identify a foreign object. If this was a wooden tooth pick, wood often shows air density by CT. One  could question a linear air shadow in the right-side of the esophagus or esophageal wall at the thoracic inlet level, but this is far from certain. Salivary glands: Parotid and submandibular glands are normal. Thyroid: Normal Lymph nodes: No enlarged or low-density nodes on either side of the neck. Vascular: Ordinary mild atherosclerosis of the carotid bifurcations. Limited intracranial: Negative Visualized orbits: Normal Mastoids and visualized paranasal sinuses: Clear Skeleton: Ordinary cervical spondylosis. Upper chest: Normal Other: None IMPRESSION: Inflammatory edema, stranding and swelling within the retropharyngeal soft tissues throughout the neck. With this clinical history, this raises considerable concern regarding aero digestive tract injury from the swallowed tooth pick described by history. This appears to represent phlegmonous inflammation without a discretely defined abscess. Wooden tooth picks can be very difficult to see by CT. They may show air density. I do not definitely identify that. One could question a linear air shadow in the right side of the esophagus or esophageal wall at the thoracic inlet level, but this is far from certain as there is some intraluminal air also in this region. Electronically Signed   By: Nelson Chimes M.D.   On: 03/05/2018 12:35    Assessment/Plan Active Problems:   Retropharyngeal abscess  Principal Problem Retropharyngeal soft tissue inflammation -Patient placed on clindamycin and ENT request, will admit to [facility], keep n.p.o. as he will likely go to the operating room.  Active Problems Diabetes mellitus -Hold p.o. medications and placed on sliding scale  Hypertension -Hold p.o. medications as he is strict n.p.o., placed on hydralazine PRN  Coronary artery disease -No chest pain, this is stable  HLD -resume home meds once no longer NPO   DVT prophylaxis: SCDs Code Status: Full code Family Communication: No family at bedside Disposition  Plan: Likely home when ready Consults called: ENT, Dr. Benjamine Mola called by EDP   Marzetta Board, MD, PhD [facility]  Contact via [email or web address]  [place] P: [phone]  F: [phone]   03/05/2018, 2:22 PM

## 2018-03-05 NOTE — Anesthesia Preprocedure Evaluation (Addendum)
Anesthesia Evaluation  Patient identified by MRN, date of birth, ID band Patient awake    Reviewed: Allergy & Precautions, NPO status , Patient's Chart, lab work & pertinent test results, reviewed documented beta blocker date and time   History of Anesthesia Complications Negative for: history of anesthetic complications  Airway Mallampati: II  TM Distance: >3 FB Neck ROM: Full    Dental  (+) Dental Advisory Given   Pulmonary neg pulmonary ROS,    breath sounds clear to auscultation       Cardiovascular Exercise Tolerance: Good hypertension, Pt. on medications and Pt. on home beta blockers + CAD and + CABG   Rhythm:Regular Rate:Normal     Neuro/Psych negative neurological ROS  negative psych ROS   GI/Hepatic Neg liver ROS, hiatal hernia, PUD, GERD  Medicated and Controlled,  Endo/Other  diabetes  Renal/GU negative Renal ROS  negative genitourinary   Musculoskeletal negative musculoskeletal ROS (+)   Abdominal   Peds  Hematology negative hematology ROS (+)   Anesthesia Other Findings   Reproductive/Obstetrics                           Anesthesia Physical Anesthesia Plan  ASA: III  Anesthesia Plan: General   Post-op Pain Management:    Induction: Intravenous  PONV Risk Score and Plan: 2 and Ondansetron, Dexamethasone and Treatment may vary due to age or medical condition  Airway Management Planned: Oral ETT  Additional Equipment: None  Intra-op Plan:   Post-operative Plan: Extubation in OR  Informed Consent: I have reviewed the patients History and Physical, chart, labs and discussed the procedure including the risks, benefits and alternatives for the proposed anesthesia with the patient or authorized representative who has indicated his/her understanding and acceptance.   Dental advisory given  Plan Discussed with: CRNA, Anesthesiologist and Surgeon  Anesthesia Plan  Comments:      Anesthesia Quick Evaluation

## 2018-03-05 NOTE — Consult Note (Signed)
Reason for Consult: Retropharyngeal infection, possible foreign body   HPI:  Chad Blair is an 68 y.o. male who was previously seen for a possible foreign body in his throat. The patient is seen in consultation requested by [facility]. The patient was eating bacon wrapped [place] and swallowed a part of a toothpick. When the patient was evaluated in my office 2 days ago, no abnormality was noted within his pharynx or larynx. No obvious foreign body was visualized on the laryngoscopy examination.  His foreign body sensation persistent in the neck and getting a little bit worse, today patient saw his PCP and underwent a CT scan of the neck which showed "inflammatory edema, stranding and swelling within the retropharyngeal soft tissues throughout the neck". The patient is therefore admitted for IV antibiotic treatment. The plan is to take the patient to the operating room tomorrow morning for direct laryngoscopy and esophagoscopy, with possible foreign body removal.  Past Medical History:  Diagnosis Date  . Coronary artery disease   . Diabetes mellitus   . GERD (gastroesophageal reflux disease)   . Hiatal hernia   . Hyperlipidemia   . PUD (peptic ulcer disease)     Past Surgical History:  Procedure Laterality Date  . CHOLECYSTECTOMY    . COLONOSCOPY  11/21/2010   Procedure: COLONOSCOPY;  Surgeon: Rogene Houston, MD;  [facility];  Service: Endoscopy;  Laterality: N/A;  . CORONARY ARTERY BYPASS GRAFT     x4  . WRIST SURGERY     right    Family History  Problem Relation Age of Onset  . Coronary artery disease Father 74       died  . Coronary artery disease Mother 29    Social History:  reports that he has never smoked. He has never used smokeless tobacco. He reports that he does not drink alcohol or use drugs.  Allergies: No Known Allergies  Prior to Admission medications   Medication Sig Start Date End Date Taking? Authorizing Provider  aspirin 81 MG tablet  Take 81 mg by mouth daily.    [provider]  atenolol (TENORMIN) 50 MG tablet TAKE 1 TABLET DAILY Patient taking differently: Take 50 mg by mouth daily.  01/05/18   Arnoldo Lenis, MD  atorvastatin (LIPITOR) 80 MG tablet TAKE 1 TABLET DAILY Patient taking differently: Take 80 mg by mouth every evening.  08/11/17   Arnoldo Lenis, MD  dapagliflozin propanediol (FARXIGA) 5 MG TABS tablet Take 2.5 mg by mouth 2 (two) times daily.    [provider]  fenofibrate (TRICOR) 145 MG tablet TAKE 1 TABLET DAILY Patient taking differently: Take 145 mg by mouth every morning.  08/25/17   Arnoldo Lenis, MD  glimepiride (AMARYL) 2 MG tablet Take 2 mg by mouth 2 (two) times daily.  01/22/16   [provider]  Multiple Vitamins-Minerals (MULTI FOR HIM PO) Take 1 tablet by mouth daily.      [provider]  nabumetone (RELAFEN) 750 MG tablet Take 750 mg by mouth 2 (two) times daily. For arthritis     [provider]  omeprazole (PRILOSEC) 40 MG capsule Take 40 mg by mouth every morning.  12/22/15   [provider]  sitaGLIPtan-metformin (JANUMET) 50-1000 MG per tablet Take 1 tablet by mouth 2 (two) times daily with a meal.      [provider]  trandolapril (MAVIK) 4 MG tablet TAKE 1 TABLET DAILY Patient taking differently: Take 4 mg  by mouth every morning.  07/14/17   Arnoldo Lenis, MD     Results for orders placed or performed during the hospital encounter of 03/05/18 (from the past 48 hour(s))  CBC with Differential/Platelet     Status: Abnormal   Collection Time: 03/05/18  1:27 PM  Result Value Ref Range   WBC 13.1 (H) 4.0 - 10.5 K/uL   RBC 5.49 4.22 - 5.81 MIL/uL   Hemoglobin 16.3 13.0 - 17.0 g/dL   HCT 48.4 39.0 - 52.0 %   MCV 88.2 80.0 - 100.0 fL   MCH 29.7 26.0 - 34.0 pg   MCHC 33.7 30.0 - 36.0 g/dL   RDW 13.7 11.5 - 15.5 %   Platelets 256 150 - 400 K/uL   nRBC 0.0 0.0 - 0.2 %   Neutrophils Relative % 81 %   Neutro  Abs 10.5 (H) 1.7 - 7.7 K/uL   Lymphocytes Relative 9 %   Lymphs Abs 1.1 0.7 - 4.0 K/uL   Monocytes Relative 10 %   Monocytes Absolute 1.4 (H) 0.1 - 1.0 K/uL   Eosinophils Relative 0 %   Eosinophils Absolute 0.1 0.0 - 0.5 K/uL   Basophils Relative 0 %   Basophils Absolute 0.0 0.0 - 0.1 K/uL   Immature Granulocytes 0 %   Abs Immature Granulocytes 0.04 0.00 - 0.07 K/uL    Comment: Performed at [facility], [address] [place], [place] [address]  Comprehensive metabolic panel     Status: Abnormal   Collection Time: 03/05/18  1:27 PM  Result Value Ref Range   Sodium 133 (L) 135 - 145 mmol/L   Potassium 3.9 3.5 - 5.1 mmol/L   Chloride 101 98 - 111 mmol/L   CO2 22 22 - 32 mmol/L   Glucose, Bld 134 (H) 70 - 99 mg/dL   BUN 19 8 - 23 mg/dL   Creatinine, Ser 0.90 0.61 - 1.24 mg/dL   Calcium 9.6 8.9 - 10.3 mg/dL   Total Protein 7.8 6.5 - 8.1 g/dL   Albumin 4.4 3.5 - 5.0 g/dL   AST 19 15 - 41 U/L   ALT 36 0 - 44 U/L   Alkaline Phosphatase 64 38 - 126 U/L   Total Bilirubin 1.2 0.3 - 1.2 mg/dL   GFR calc non Af Amer >60 >60 mL/min   GFR calc Af Amer >60 >60 mL/min   Anion gap 10 5 - 15    Comment: Performed at [facility], [address] [place], [place] [address]  I-Stat CG4 Lactic Acid, ED     Status: None   Collection Time: 03/05/18  1:32 PM  Result Value Ref Range   Lactic Acid, Venous 1.09 0.5 - 1.9 mmol/L    Ct Soft Tissue Neck W Contrast  Result Date: 03/05/2018 CLINICAL DATA:  Patient swallowed tooth pick 3 days ago. Throat pain. Negative endoscopy exam. EXAM: CT NECK WITH CONTRAST TECHNIQUE: Multidetector CT imaging of the neck was performed using the standard protocol following the bolus administration of intravenous contrast. CONTRAST:  39mL OMNIPAQUE IOHEXOL 300 MG/ML  SOLN COMPARISON:  None. FINDINGS: Pharynx and larynx: The patient shows evidence of inflammatory edema and stranding within the retropharyngeal soft tissues from the region of C2 to T1. Given the clinical  history, this raises considerable concern regarding aero digestive tract injury from the swallowed tooth pick with regional inflammation. This appears to be phlegmonous inflammation at this time without a clearly defined low-density abscess. I am unable to clearly identify a  foreign object. If this was a wooden tooth pick, wood often shows air density by CT. One could question a linear air shadow in the right-side of the esophagus or esophageal wall at the thoracic inlet level, but this is far from certain. Salivary glands: Parotid and submandibular glands are normal. Thyroid: Normal Lymph nodes: No enlarged or low-density nodes on either side of the neck. Vascular: Ordinary mild atherosclerosis of the carotid bifurcations. Limited intracranial: Negative Visualized orbits: Normal Mastoids and visualized paranasal sinuses: Clear Skeleton: Ordinary cervical spondylosis. Upper chest: Normal Other: None IMPRESSION: Inflammatory edema, stranding and swelling within the retropharyngeal soft tissues throughout the neck. With this clinical history, this raises considerable concern regarding aero digestive tract injury from the swallowed tooth pick described by history. This appears to represent phlegmonous inflammation without a discretely defined abscess. Wooden tooth picks can be very difficult to see by CT. They may show air density. I do not definitely identify that. One could question a linear air shadow in the right side of the esophagus or esophageal wall at the thoracic inlet level, but this is far from certain as there is some intraluminal air also in this region. Electronically Signed   By: Nelson Chimes M.D.   On: 03/05/2018 12:35   Physical Exam: General: Communicates without difficulty, well nourished, no acute distress. Head: Normocephalic, no evidence injury, no tenderness, facial buttresses intact without stepoff. Eyes: PERRL, EOMI.  No scleral icterus, conjunctivae clear. Ears: External auditory canals  clear bilaterally.  There is no edema or erythema.  Tympanic membrane is within normal limits bilaterally. Nose: Normal skin and external support.  Anterior rhinoscopy reveals healthy pink mucosa over the septum and turbinates.  No lesions or polyps were seen. Oral cavity: Lips without lesions, oral mucosa moist, no masses or lesions seen. Indirect  mirror laryngoscopy could not be tolerated. Pharynx: Clear, no erythema. Neck: Supple, full range of motion, no lymphadenopathy, no masses palpable. Salivary: Parotid and submandibular glands without mass. Neuro:  CN 2-12 grossly intact. Gait normal. Vestibular: No nystagmus at any point of gaze.   Assessment/Plan: Persistent throat pain, with possible foreign body ingestion. His CT scan today is concerning for possible retropharyngeal infection. The patient will be admitted for IV antibiotics treatment. He is scheduled to undergo direct laryngoscopy and esophagoscopy tomorrow at 11 AM. NPO after midnight.  Kitt Minardi W Aaliyha Mumford 03/05/2018, 4:56 PM

## 2018-03-05 NOTE — ED Notes (Addendum)
Dr. Benjamine Mola in rm to see the Pt.

## 2018-03-05 NOTE — ED Provider Notes (Signed)
[facility] EMERGENCY DEPARTMENT Provider Note   CSN: [id number] Arrival date & time: 03/05/18  1256     History   Chief Complaint Chief Complaint  Patient presents with  . Swallowed Foreign Body    HPI Chad Blair is a 68 y.o. male.  HPI  68 year old male, he has a known history of diabetes acid reflux coronary disease and peptic ulcer disease, states that he was eating a bacon wrapped food product on Monday night, there were toothpicks and this, he thinks that he swallowed a toothpick and felt acute onset of a foreign body sensation in his neck.  Initial evaluation in the emergency department was unremarkable and the patient was able to swallow graham crackers without any difficulties, observation.  Without any concern and the patient was discharged.  He followed up with the ear nose and throat doctor who scoped him and stated to him that there was no visible foreign bodies, he was encouraged to stay n.p.o. for 24 hours.  He presented today to his family doctor's office where he was having ongoing pain and now having some chills and discomfort in his neck and upper chest, he was referred to the emergency department after having a CT scan showing some phlegmonous changes around the upper esophagus and a possible perforation.  No discrete abscess was seen.  The symptoms have been persistent, gradually worsening, seems to get worse with time, no associated measured fever, no medication given at home however he did receive steroid and antibiotic treatment in the office prior to being sent to the CT scan.  Past Medical History:  Diagnosis Date  . Coronary artery disease   . Diabetes mellitus   . GERD (gastroesophageal reflux disease)   . Hiatal hernia   . Hyperlipidemia   . PUD (peptic ulcer disease)     Patient Active Problem List   Diagnosis Date Noted  . Retropharyngeal abscess 03/05/2018  . Coronary artery disease 11/27/2010  . HYPERTENSION, BENIGN 12/16/2008  . DIABETES  MELLITUS, TYPE II 11/21/2008  . HYPERLIPIDEMIA-MIXED 11/21/2008  . GERD 11/21/2008  . HIATAL HERNIA 11/21/2008  . PUD, HX OF 11/21/2008    Past Surgical History:  Procedure Laterality Date  . CHOLECYSTECTOMY    . COLONOSCOPY  11/21/2010   Procedure: COLONOSCOPY;  Surgeon: Rogene Houston, MD;  [facility];  Service: Endoscopy;  Laterality: N/A;  . CORONARY ARTERY BYPASS GRAFT     x4  . WRIST SURGERY     right        Home Medications    Prior to Admission medications   Medication Sig Start Date End Date Taking? Authorizing Provider  aspirin 81 MG tablet Take 81 mg by mouth daily.   Yes [provider]  atenolol (TENORMIN) 50 MG tablet TAKE 1 TABLET DAILY Patient taking differently: Take 50 mg by mouth daily.  01/05/18  Yes BranchAlphonse Guild, MD  atorvastatin (LIPITOR) 80 MG tablet TAKE 1 TABLET DAILY Patient taking differently: Take 80 mg by mouth every evening.  08/11/17  Yes BranchAlphonse Guild, MD  dapagliflozin propanediol (FARXIGA) 5 MG TABS tablet Take 2.5 mg by mouth 2 (two) times daily.   Yes [provider]  fenofibrate (TRICOR) 145 MG tablet TAKE 1 TABLET DAILY Patient taking differently: Take 145 mg by mouth every morning.  08/25/17  Yes Branch, Alphonse Guild, MD  glimepiride (AMARYL) 2 MG tablet Take 2 mg by mouth 2 (two) times daily.  01/22/16  Yes [provider]  Multiple Vitamins-Minerals (MULTI FOR HIM PO) Take 1 tablet by mouth daily.     Yes [provider]  nabumetone (RELAFEN) 750 MG tablet Take 750 mg by mouth 2 (two) times daily. For arthritis    Yes [provider]  omeprazole (PRILOSEC) 40 MG capsule Take 40 mg by mouth every morning.  12/22/15  Yes [provider]  sitaGLIPtan-metformin (JANUMET) 50-1000 MG per tablet Take 1 tablet by mouth 2 (two) times daily with a meal.     Yes [provider]  trandolapril ([place]) 4 MG tablet TAKE 1 TABLET DAILY Patient taking differently: Take 4 mg  by mouth every morning.  07/14/17  Yes Branch, Alphonse Guild, MD    Family History Family History  Problem Relation Age of Onset  . Coronary artery disease Father 72       died  . Coronary artery disease Mother 3    Social History Social History   Tobacco Use  . Smoking status: Never Smoker  . Smokeless tobacco: Never Used  Substance Use Topics  . Alcohol use: No    Alcohol/week: 0.0 standard drinks  . Drug use: No     Allergies   Patient has no known allergies.   Review of Systems Review of Systems  All other systems reviewed and are negative.    Physical Exam Updated Vital Signs BP 136/80 (BP Location: Left Arm)   Pulse 79   Temp 98.9 F (37.2 C) (Oral)   Resp 18   SpO2 97%   Physical Exam Vitals signs and nursing note reviewed.  Constitutional:      General: He is not in acute distress.    Appearance: He is well-developed.  HENT:     Head: Normocephalic and atraumatic.     Mouth/Throat:     Pharynx: No oropharyngeal exudate.     Comments: The oropharynx is clear, there is no signs of purulent discharge, masses, swelling, foreign bodies. Eyes:     General: No scleral icterus.       Right eye: No discharge.        Left eye: No discharge.     Conjunctiva/sclera: Conjunctivae normal.     Pupils: Pupils are equal, round, and reactive to light.  Neck:     Musculoskeletal: Normal range of motion and neck supple.     Thyroid: No thyromegaly.     Vascular: No JVD.     Comments: The patient is not restricting his range of motion but does have pain with flexion and extension Cardiovascular:     Rate and Rhythm: Normal rate and regular rhythm.     Heart sounds: Normal heart sounds. No murmur. No friction rub. No gallop.   Pulmonary:     Effort: Pulmonary effort is normal. No respiratory distress.     Breath sounds: Normal breath sounds. No wheezing or rales.  Abdominal:     General: Bowel sounds are normal. There is no distension.     Palpations: Abdomen is  soft. There is no mass.     Tenderness: There is no abdominal tenderness.  Musculoskeletal: Normal range of motion.        General: No tenderness.  Lymphadenopathy:     Cervical: No cervical adenopathy.  Skin:    General: Skin is warm and dry.     Findings: No erythema or rash.  Neurological:     Mental Status: He is alert.     Coordination: Coordination normal.  Psychiatric:  Behavior: Behavior normal.      ED Treatments / Results  Labs (all labs ordered are listed, but only abnormal results are displayed) Labs Reviewed  CBC WITH DIFFERENTIAL/PLATELET - Abnormal; Notable for the following components:      Result Value   WBC 13.1 (*)    Neutro Abs 10.5 (*)    Monocytes Absolute 1.4 (*)    All other components within normal limits  COMPREHENSIVE METABOLIC PANEL - Abnormal; Notable for the following components:   Sodium 133 (*)    Glucose, Bld 134 (*)    All other components within normal limits  GLUCOSE, CAPILLARY  BASIC METABOLIC PANEL  CBC  HIV ANTIBODY (ROUTINE TESTING W REFLEX)  I-STAT CG4 LACTIC ACID, ED  I-STAT CG4 LACTIC ACID, ED    EKG None  Radiology Ct Soft Tissue Neck W Contrast  Result Date: 03/05/2018 CLINICAL DATA:  Patient swallowed tooth pick 3 days ago. Throat pain. Negative endoscopy exam. EXAM: CT NECK WITH CONTRAST TECHNIQUE: Multidetector CT imaging of the neck was performed using the standard protocol following the bolus administration of intravenous contrast. CONTRAST:  63mL OMNIPAQUE IOHEXOL 300 MG/ML  SOLN COMPARISON:  None. FINDINGS: Pharynx and larynx: The patient shows evidence of inflammatory edema and stranding within the retropharyngeal soft tissues from the region of C2 to T1. Given the clinical history, this raises considerable concern regarding aero digestive tract injury from the swallowed tooth pick with regional inflammation. This appears to be phlegmonous inflammation at this time without a clearly defined low-density abscess. I  am unable to clearly identify a foreign object. If this was a wooden tooth pick, wood often shows air density by CT. One could question a linear air shadow in the right-side of the esophagus or esophageal wall at the thoracic inlet level, but this is far from certain. Salivary glands: Parotid and submandibular glands are normal. Thyroid: Normal Lymph nodes: No enlarged or low-density nodes on either side of the neck. Vascular: Ordinary mild atherosclerosis of the carotid bifurcations. Limited intracranial: Negative Visualized orbits: Normal Mastoids and visualized paranasal sinuses: Clear Skeleton: Ordinary cervical spondylosis. Upper chest: Normal Other: None IMPRESSION: Inflammatory edema, stranding and swelling within the retropharyngeal soft tissues throughout the neck. With this clinical history, this raises considerable concern regarding aero digestive tract injury from the swallowed tooth pick described by history. This appears to represent phlegmonous inflammation without a discretely defined abscess. Wooden tooth picks can be very difficult to see by CT. They may show air density. I do not definitely identify that. One could question a linear air shadow in the right side of the esophagus or esophageal wall at the thoracic inlet level, but this is far from certain as there is some intraluminal air also in this region. Electronically Signed   By: Nelson Chimes M.D.   On: 03/05/2018 12:35    Procedures .Critical Care Performed by: Noemi Chapel, MD Authorized by: Noemi Chapel, MD   Critical care provider statement:    Critical care time (minutes):  35   Critical care time was exclusive of:  Separately billable procedures and treating other patients and teaching time   Critical care was necessary to treat or prevent imminent or life-threatening deterioration of the following conditions: perforated esophagus.   Critical care was time spent personally by me on the following activities:  Blood draw for  specimens, development of treatment plan with patient or surrogate, discussions with consultants, evaluation of patient's response to treatment, examination of patient, obtaining history from patient  or surrogate, ordering and performing treatments and interventions, ordering and review of laboratory studies, ordering and review of radiographic studies, pulse oximetry, re-evaluation of patient's condition and review of old charts   (including critical care time)  Medications Ordered in ED Medications  0.9 %  sodium chloride infusion ( Intravenous New Bag/Given 03/05/18 2052)  hydrALAZINE (APRESOLINE) injection 5 mg (has no administration in time range)  insulin aspart (novoLOG) injection 0-9 Units (has no administration in time range)  clindamycin (CLEOCIN) IVPB 600 mg (600 mg Intravenous New Bag/Given 03/05/18 2053)  clindamycin (CLEOCIN) IVPB 900 mg (0 mg Intravenous Stopped 03/05/18 1522)     Initial Impression / Assessment and Plan / ED Course  I have reviewed the triage vital signs and the nursing notes.  Pertinent labs & imaging results that were available during my care of the patient were reviewed by me and considered in my medical decision making (see chart for details).     The patient does not appear sick on exam however he does have some significant findings to suggest that there is some pathology in his upper esophagus, I have reviewed the CT scan with the patient at the bedside and there does appear to be some extraluminal air suggestive of an aerodigestive tract injury.  The patient is not febrile or tachycardic but this could be a very significant injury and he is now having subjective chills.  I will discuss with ENT, get labs, the patient is n.p.o.  I reviewed the case with Dr. Benjamine Mola of the ENT service who wants to take the patient to the operating room, the patient does have an elevated white blood cell count at 13,100, lactic acid is 1.09.  He has been n.p.o. as of this morning, I  will consult with the hospitalist service to facilitate transport, ongoing antibiotics, clindamycin was recommended by Dr. Benjamine Mola.  I have ordered the initial dose.  Care was discussed with the hospitalist who will facilitate transport to [facility]  Final Clinical Impressions(s) / ED Diagnoses   Final diagnoses:  Esophageal perforation      Noemi Chapel, MD 03/05/18 2120

## 2018-03-06 ENCOUNTER — Observation Stay (HOSPITAL_COMMUNITY): Payer: Medicare Other | Admitting: Physician Assistant

## 2018-03-06 ENCOUNTER — Ambulatory Visit (HOSPITAL_COMMUNITY): Admission: RE | Admit: 2018-03-06 | Payer: Medicare Other | Source: Home / Self Care | Admitting: Otolaryngology

## 2018-03-06 ENCOUNTER — Encounter (HOSPITAL_COMMUNITY)
Admission: EM | Disposition: A | Discharge status: Home / Self Care | Payer: Self-pay | Source: Home / Self Care | Attending: Internal Medicine

## 2018-03-06 ENCOUNTER — Encounter (HOSPITAL_COMMUNITY): Payer: Self-pay | Admitting: *Deleted

## 2018-03-06 DIAGNOSIS — E118 Type 2 diabetes mellitus with unspecified complications: Secondary | ICD-10-CM

## 2018-03-06 DIAGNOSIS — I10 Essential (primary) hypertension: Secondary | ICD-10-CM | POA: Diagnosis not present

## 2018-03-06 DIAGNOSIS — Z7982 Long term (current) use of aspirin: Secondary | ICD-10-CM | POA: Diagnosis not present

## 2018-03-06 DIAGNOSIS — K219 Gastro-esophageal reflux disease without esophagitis: Secondary | ICD-10-CM | POA: Diagnosis not present

## 2018-03-06 DIAGNOSIS — Z8249 Family history of ischemic heart disease and other diseases of the circulatory system: Secondary | ICD-10-CM | POA: Diagnosis not present

## 2018-03-06 DIAGNOSIS — J390 Retropharyngeal and parapharyngeal abscess: Secondary | ICD-10-CM | POA: Diagnosis not present

## 2018-03-06 DIAGNOSIS — E782 Mixed hyperlipidemia: Secondary | ICD-10-CM | POA: Diagnosis not present

## 2018-03-06 DIAGNOSIS — Z79899 Other long term (current) drug therapy: Secondary | ICD-10-CM | POA: Diagnosis not present

## 2018-03-06 DIAGNOSIS — K449 Diaphragmatic hernia without obstruction or gangrene: Secondary | ICD-10-CM | POA: Diagnosis not present

## 2018-03-06 DIAGNOSIS — Z951 Presence of aortocoronary bypass graft: Secondary | ICD-10-CM | POA: Diagnosis not present

## 2018-03-06 DIAGNOSIS — E785 Hyperlipidemia, unspecified: Secondary | ICD-10-CM

## 2018-03-06 DIAGNOSIS — Z8711 Personal history of peptic ulcer disease: Secondary | ICD-10-CM | POA: Diagnosis not present

## 2018-03-06 DIAGNOSIS — E119 Type 2 diabetes mellitus without complications: Secondary | ICD-10-CM | POA: Diagnosis not present

## 2018-03-06 DIAGNOSIS — K208 Other esophagitis: Secondary | ICD-10-CM | POA: Diagnosis not present

## 2018-03-06 DIAGNOSIS — Z7984 Long term (current) use of oral hypoglycemic drugs: Secondary | ICD-10-CM | POA: Diagnosis not present

## 2018-03-06 DIAGNOSIS — Z9049 Acquired absence of other specified parts of digestive tract: Secondary | ICD-10-CM | POA: Diagnosis not present

## 2018-03-06 DIAGNOSIS — I2510 Atherosclerotic heart disease of native coronary artery without angina pectoris: Secondary | ICD-10-CM | POA: Diagnosis not present

## 2018-03-06 DIAGNOSIS — S1124XA Puncture wound with foreign body of pharynx and cervical esophagus, initial encounter: Secondary | ICD-10-CM | POA: Diagnosis not present

## 2018-03-06 HISTORY — PX: LARYNGOSCOPY AND ESOPHAGOSCOPY: SHX5660

## 2018-03-06 HISTORY — PX: OTHER SURGICAL HISTORY: SHX169

## 2018-03-06 LAB — SURGICAL PCR SCREEN
MRSA, PCR: NEGATIVE
Staphylococcus aureus: NEGATIVE

## 2018-03-06 LAB — GLUCOSE, CAPILLARY
Glucose-Capillary: 116 mg/dL — ABNORMAL HIGH (ref 70–99)
Glucose-Capillary: 119 mg/dL — ABNORMAL HIGH (ref 70–99)
Glucose-Capillary: 100 mg/dL — ABNORMAL HIGH (ref 70–99)
Glucose-Capillary: 235 mg/dL — ABNORMAL HIGH (ref 70–99)
Glucose-Capillary: 416 mg/dL — ABNORMAL HIGH (ref 70–99)

## 2018-03-06 LAB — BASIC METABOLIC PANEL
Anion gap: 11 (ref 5–15)
BUN: 13 mg/dL (ref 8–23)
CO2: 19 mmol/L — ABNORMAL LOW (ref 22–32)
CREATININE: 0.88 mg/dL (ref 0.61–1.24)
Calcium: 9 mg/dL (ref 8.9–10.3)
Chloride: 105 mmol/L (ref 98–111)
GFR calc Af Amer: >60 mL/min (ref >60)
GFR calc non Af Amer: >60 mL/min (ref >60)
Glucose, Bld: 99 mg/dL (ref 70–99)
Potassium: 4.1 mmol/L (ref 3.5–5.1)
Sodium: 135 mmol/L (ref 135–145)

## 2018-03-06 LAB — CBC
HCT: 43 % (ref 39.0–52.0)
Hemoglobin: 14.1 g/dL (ref 13.0–17.0)
MCH: 28.7 pg (ref 26.0–34.0)
MCHC: 32.8 g/dL (ref 30.0–36.0)
MCV: 87.4 fL (ref 80.0–100.0)
NRBC: 0 % (ref 0.0–0.2)
Platelets: 203 10*3/uL (ref 150–400)
RBC: 4.92 MIL/uL (ref 4.22–5.81)
RDW: 13.7 % (ref 11.5–15.5)
WBC: 7.5 10*3/uL (ref 4.0–10.5)

## 2018-03-06 LAB — HIV ANTIBODY (ROUTINE TESTING W REFLEX): HIV Screen 4th Generation wRfx: NONREACTIVE

## 2018-03-06 SURGERY — LARYNGOSCOPY, WITH ESOPHAGOSCOPY
Anesthesia: General

## 2018-03-06 MED ORDER — PROPOFOL 10 MG/ML IV BOLUS
INTRAVENOUS | Status: AC
  Filled 2018-03-06: qty 20

## 2018-03-06 MED ORDER — ONDANSETRON HCL 4 MG/2ML IJ SOLN
INTRAMUSCULAR | Status: DC | PRN
  Administered 2018-03-06: 4 mg via INTRAVENOUS

## 2018-03-06 MED ORDER — ONDANSETRON HCL 4 MG/2ML IJ SOLN
INTRAMUSCULAR | Status: AC
  Filled 2018-03-06: qty 2

## 2018-03-06 MED ORDER — SUCCINYLCHOLINE CHLORIDE 200 MG/10ML IV SOSY
PREFILLED_SYRINGE | INTRAVENOUS | Status: DC | PRN
  Administered 2018-03-06: 100 mg via INTRAVENOUS

## 2018-03-06 MED ORDER — PHENYLEPHRINE 40 MCG/ML (10ML) SYRINGE FOR IV PUSH (FOR BLOOD PRESSURE SUPPORT)
PREFILLED_SYRINGE | INTRAVENOUS | Status: AC
  Filled 2018-03-06: qty 10

## 2018-03-06 MED ORDER — FENTANYL CITRATE (PF) 250 MCG/5ML IJ SOLN
INTRAMUSCULAR | Status: DC | PRN
  Administered 2018-03-06 (×3): 50 ug via INTRAVENOUS

## 2018-03-06 MED ORDER — ROCURONIUM BROMIDE 10 MG/ML (PF) SYRINGE
PREFILLED_SYRINGE | INTRAVENOUS | Status: DC | PRN
  Administered 2018-03-06: 40 mg via INTRAVENOUS

## 2018-03-06 MED ORDER — PROPOFOL 10 MG/ML IV BOLUS
INTRAVENOUS | Status: DC | PRN
  Administered 2018-03-06: 150 mg via INTRAVENOUS
  Administered 2018-03-06: 50 mg via INTRAVENOUS

## 2018-03-06 MED ORDER — MIDAZOLAM HCL 2 MG/2ML IJ SOLN
INTRAMUSCULAR | Status: AC
  Filled 2018-03-06: qty 2

## 2018-03-06 MED ORDER — 0.9 % SODIUM CHLORIDE (POUR BTL) OPTIME
TOPICAL | Status: DC | PRN
  Administered 2018-03-06: 1000 mL

## 2018-03-06 MED ORDER — FENTANYL CITRATE (PF) 250 MCG/5ML IJ SOLN
INTRAMUSCULAR | Status: AC
  Filled 2018-03-06: qty 5

## 2018-03-06 MED ORDER — DEXAMETHASONE SODIUM PHOSPHATE 10 MG/ML IJ SOLN
INTRAMUSCULAR | Status: DC | PRN
  Administered 2018-03-06: 10 mg via INTRAVENOUS

## 2018-03-06 MED ORDER — LIDOCAINE 2% (20 MG/ML) 5 ML SYRINGE
INTRAMUSCULAR | Status: DC | PRN
  Administered 2018-03-06: 60 mg via INTRAVENOUS

## 2018-03-06 MED ORDER — CLINDAMYCIN PHOSPHATE 600 MG/50ML IV SOLN
600.0000 mg | Freq: Four times a day (QID) | INTRAVENOUS | Status: DC
  Administered 2018-03-06 – 2018-03-07 (×3): 600 mg via INTRAVENOUS
  Filled 2018-03-06 (×4): qty 50

## 2018-03-06 MED ORDER — INSULIN ASPART 100 UNIT/ML ~~LOC~~ SOLN
11.0000 [IU] | Freq: Once | SUBCUTANEOUS | Status: AC
  Administered 2018-03-06: 11 [IU] via SUBCUTANEOUS

## 2018-03-06 SURGICAL SUPPLY — 24 items
BLADE SURG 15 STRL LF DISP TIS (BLADE) IMPLANT
BLADE SURG 15 STRL SS (BLADE)
CANISTER SUCT 3000ML PPV (MISCELLANEOUS) ×3 IMPLANT
CONT SPEC 4OZ CLIKSEAL STRL BL (MISCELLANEOUS) IMPLANT
COVER BACK TABLE 60X90IN (DRAPES) ×3 IMPLANT
COVER WAND RF STERILE (DRAPES) ×3 IMPLANT
DRAPE HALF SHEET 40X57 (DRAPES) ×3 IMPLANT
GAUZE SPONGE 4X4 12PLY STRL (GAUZE/BANDAGES/DRESSINGS) ×3 IMPLANT
GLOVE BIO SURGEON STRL SZ7.5 (GLOVE) ×3 IMPLANT
GOWN STRL REUS W/ TWL LRG LVL3 (GOWN DISPOSABLE) ×2 IMPLANT
GOWN STRL REUS W/TWL LRG LVL3 (GOWN DISPOSABLE) ×6
KIT BASIN OR (CUSTOM PROCEDURE TRAY) ×3 IMPLANT
KIT TURNOVER KIT B (KITS) ×3 IMPLANT
NDL HYPO 25GX1X1/2 BEV (NEEDLE) IMPLANT
NEEDLE HYPO 25GX1X1/2 BEV (NEEDLE) IMPLANT
NS IRRIG 1000ML POUR BTL (IV SOLUTION) ×3 IMPLANT
PAD ARMBOARD 7.5X6 YLW CONV (MISCELLANEOUS) ×6 IMPLANT
PATTIES SURGICAL .5 X1 (DISPOSABLE) IMPLANT
SOLUTION ANTI FOG 6CC (MISCELLANEOUS) IMPLANT
SURGILUBE 2OZ TUBE FLIPTOP (MISCELLANEOUS) ×1 IMPLANT
TOWEL OR 17X24 6PK STRL BLUE (TOWEL DISPOSABLE) ×6 IMPLANT
TUBE CONNECTING 12'X1/4 (SUCTIONS) ×1
TUBE CONNECTING 12X1/4 (SUCTIONS) ×2 IMPLANT
WATER STERILE IRR 1000ML POUR (IV SOLUTION) IMPLANT

## 2018-03-06 NOTE — Anesthesia Postprocedure Evaluation (Signed)
Anesthesia Post Note  Patient: Chad Blair  Procedure(s) Performed: DIRECT LARYNGOSCOPY AND ESOPHAGOSCOPY, (N/A )     Patient location during evaluation: PACU Anesthesia Type: General Level of consciousness: awake and alert Pain management: pain level controlled Vital Signs Assessment: post-procedure vital signs reviewed and stable Respiratory status: spontaneous breathing, nonlabored ventilation and respiratory function stable Cardiovascular status: blood pressure returned to baseline and stable Postop Assessment: no apparent nausea or vomiting Anesthetic complications: no    Last Vitals:  Vitals:   03/06/18 1326 03/06/18 1347  BP: (!) 152/80 (!) 129/96  Pulse: 85 88  Resp: 17 17  Temp: 36.7 C 37.4 C  SpO2: 96% 97%    Last Pain:  Vitals:   03/06/18 1347  TempSrc: Oral  PainSc:                  Audry Pili

## 2018-03-06 NOTE — Transfer of Care (Signed)
Immediate Anesthesia Transfer of Care Note  Patient: Chad Blair  Procedure(s) Performed: DIRECT LARYNGOSCOPY AND ESOPHAGOSCOPY, (N/A )  Patient Location: PACU  Anesthesia Type:General  Level of Consciousness: awake, oriented and patient cooperative  Airway & Oxygen Therapy: Patient Spontanous Breathing and Patient connected to nasal cannula oxygen  Post-op Assessment: Report given to RN and Post -op Vital signs reviewed and stable  Post vital signs: Reviewed and stable  Last Vitals:  Vitals Value Taken Time  BP    Temp    Pulse    Resp    SpO2      Last Pain:  Vitals:   03/06/18 1025  TempSrc: Oral  PainSc:       Patients Stated Pain Goal: 0 (71/[id number])  Complications: No apparent anesthesia complications

## 2018-03-06 NOTE — Anesthesia Procedure Notes (Signed)
Procedure Name: Intubation Date/Time: 03/06/2018 12:05 PM Performed by: Renato Shin, CRNA Pre-anesthesia Checklist: Patient identified, Emergency Drugs available, Suction available and Patient being monitored Patient Re-evaluated:Patient Re-evaluated prior to induction Oxygen Delivery Method: Circle system utilized Preoxygenation: Pre-oxygenation with 100% oxygen Induction Type: IV induction Ventilation: Oral airway inserted - appropriate to patient size and Mask ventilation without difficulty Laryngoscope Size: Glidescope and 4 Grade View: Grade I Tube type: Oral Tube size: 7.0 mm Number of attempts: 1 Airway Equipment and Method: Rigid stylet and Video-laryngoscopy Placement Confirmation: ETT inserted through vocal cords under direct vision,  positive ETCO2 and breath sounds checked- equal and bilateral Secured at: 23 cm Tube secured with: Tape Dental Injury: Teeth and Oropharynx as per pre-operative assessment  Difficulty Due To: Difficulty was unanticipated and Difficult Airway- due to reduced neck mobility Future Recommendations: Recommend- induction with short-acting agent, and alternative techniques readily available Comments: Miller 3, grade 3-4 view. Limited neck extension. Good view with Glidescope. Easy mask with OA

## 2018-03-06 NOTE — Progress Notes (Signed)
Returned to 6n10 from PACU at this time. Denies pain/nausea

## 2018-03-06 NOTE — Op Note (Signed)
DATE OF PROCEDURE:  03/06/2018                              OPERATIVE REPORT  SURGEON:  Leta Baptist, MD  PREOPERATIVE DIAGNOSES: 1. Retropharyngeal phlegmon 2. Foreign body ingestion  POSTOPERATIVE DIAGNOSES: 1. Retropharyngeal phlegmon 2. Foreign body ingestion  PROCEDURE PERFORMED:   1. Direct laryngoscopy 2. Rigid esophagoscopy  ANESTHESIA:  General endotracheal tube anesthesia.  COMPLICATIONS:  None.  ESTIMATED BLOOD LOSS:  Minimal.  INDICATION FOR PROCEDURE:  Chad Blair is a 68 y.o. male who recently ingested a toothpick. His initial evaluation showed no obvious abnormality in his pharynx or larynx. However, he continued to have severe sore throat, with increasing difficulty tolerating oral intake. He subsequently underwent a CT scan. The CT showed inflammation of the retropharynx, suggesting a possible phlegmon. The CT also showed a possible foreign body within the proximal esophagus. Based on the above findings, the decision was made for the patient to undergo the above-stated procedures, with possible foreign body removal. Likelihood of success in reducing symptoms was also discussed.  The risks, benefits, alternatives, and details of the procedure were discussed with the patient.  Questions were invited and answered.  Informed consent was obtained.  DESCRIPTION:  The patient was taken to the operating room and placed supine on the operating table.  General endotracheal tube anesthesia was administered by the anesthesiologist.  The patient was positioned and prepped and draped in a standard fashion for endoscopy procedure.  A Dedo laryngoscope was used for the laryngoscopy portion of the case. The laryngoscope was inserted via the oral cavity into the pharynx. The epiglottis, vallecula, aryepiglottic folds, vocal cords, and piriform sinuses were all normal. A small amount of abrasion was noted on the posterior wall of the pharynx. No foreign body was noted.  The Dedo laryngoscope  was withdrawn. A rigid esophagoscope was then inserted via the oral cavity into the esophageal inlet. The esophagoscope was advanced along the esophagus to examine the esophageal lumen. No foreign body was noted. The rigid esophagoscope was withdrawn.  The care of the patient was turned over to the anesthesiologist.  The patient was awakened from anesthesia without difficulty.  The patient was extubated and transferred to the recovery room in good condition.  OPERATIVE FINDINGS:  No foreign body was noted on today's direct laryngoscopy and esophagoscopy evaluation. No significant erythema or purulent drainage is noted today.  SPECIMEN:  None  FOLLOWUP CARE:  The patient will be returned to his hospital bed. He will continue with his IV antibiotic treatment.   Musa Rewerts W Reece Fehnel 03/06/2018 12:20 PM

## 2018-03-07 ENCOUNTER — Encounter (HOSPITAL_COMMUNITY): Payer: Self-pay | Admitting: Otolaryngology

## 2018-03-07 DIAGNOSIS — S1124XA Puncture wound with foreign body of pharynx and cervical esophagus, initial encounter: Secondary | ICD-10-CM

## 2018-03-07 DIAGNOSIS — J390 Retropharyngeal and parapharyngeal abscess: Secondary | ICD-10-CM

## 2018-03-07 LAB — GLUCOSE, CAPILLARY
Glucose-Capillary: 160 mg/dL — ABNORMAL HIGH (ref 70–99)
Glucose-Capillary: 149 mg/dL — ABNORMAL HIGH (ref 70–99)

## 2018-03-07 MED ORDER — NABUMETONE 500 MG PO TABS
750.0000 mg | ORAL_TABLET | Freq: Two times a day (BID) | ORAL | Status: DC
  Administered 2018-03-07: 750 mg via ORAL
  Filled 2018-03-07: qty 2

## 2018-03-07 MED ORDER — HYDROCODONE-ACETAMINOPHEN 5-325 MG PO TABS: 1.0000 | ORAL_TABLET | Freq: Four times a day (QID) | ORAL | Status: DC | PRN

## 2018-03-07 MED ORDER — DOXYCYCLINE HYCLATE 50 MG PO CAPS: 100.0000 mg | ORAL_CAPSULE | Freq: Two times a day (BID) | ORAL | 0 refills | Status: DC

## 2018-03-07 MED ORDER — ATENOLOL 50 MG PO TABS
50.0000 mg | ORAL_TABLET | Freq: Every day | ORAL | Status: DC
  Administered 2018-03-07: 50 mg via ORAL
  Filled 2018-03-07: qty 1

## 2018-03-07 MED ORDER — INSULIN ASPART 100 UNIT/ML ~~LOC~~ SOLN
0.0000 [IU] | SUBCUTANEOUS | Status: DC
  Administered 2018-03-07: 2 [IU] via SUBCUTANEOUS
  Administered 2018-03-07: 3 [IU] via SUBCUTANEOUS

## 2018-03-07 MED ORDER — FENOFIBRATE 54 MG PO TABS
54.0000 mg | ORAL_TABLET | Freq: Every day | ORAL | Status: DC
  Administered 2018-03-07: 54 mg via ORAL
  Filled 2018-03-07: qty 1

## 2018-03-07 MED ORDER — ENOXAPARIN SODIUM 40 MG/0.4ML ~~LOC~~ SOLN: 40.0000 mg | SUBCUTANEOUS | Status: DC

## 2018-03-07 MED ORDER — ASPIRIN 81 MG PO CHEW
81.0000 mg | CHEWABLE_TABLET | Freq: Every day | ORAL | Status: DC
  Administered 2018-03-07: 81 mg via ORAL
  Filled 2018-03-07: qty 1

## 2018-03-07 MED ORDER — PANTOPRAZOLE SODIUM 40 MG PO TBEC
40.0000 mg | DELAYED_RELEASE_TABLET | Freq: Every day | ORAL | Status: DC
  Administered 2018-03-07: 40 mg via ORAL
  Filled 2018-03-07: qty 1

## 2018-03-07 MED ORDER — TRANDOLAPRIL 2 MG PO TABS
4.0000 mg | ORAL_TABLET | Freq: Every morning | ORAL | Status: DC
  Administered 2018-03-07: 4 mg via ORAL
  Filled 2018-03-07: qty 2
  Filled 2018-03-07: qty 1

## 2018-03-07 NOTE — Progress Notes (Signed)
Progress Note    Chad Blair  BWI:[id number] DOB: 1950-07-17  DOA: 03/05/2018 PCP: Sharilyn Sites, MD    Brief Narrative:   Chief complaint: Throat pain  Medical records reviewed and are as summarized below:  Chad Blair is an 68 y.o. male with medical history significant of coronary artery disease, diabetes mellitus, hypertension, hyperlipidemia who comes to the hospital with chief complaint of throat pain.  Patient reports that on Monday evening while eating dinner he thinks he inadvertently swallowed a toothpick. He presented to the emergency room on 03/02/2018, he was evaluated, and he was able to eat and drink without difficulties and was discharged home.  He was evaluated by ENT yesterday, Dr. Benjamine Mola, as an outpatient and evaluation was without acute findings.  His foreign body sensation persistent in the neck and in fact getting a little bit worse, today patient saw his PCP and underwent a CT scan of the neck which showed "inflammatory edema, stranding and swelling within the retropharyngeal soft tissues throughout the neck. With this clinical history, this raises considerable concern regarding aero digestive tract injury from the swallowed tooth pick described by history. This appears to represent phlegmonous inflammation without a discretely defined abscess".  EDP discussed findings with Dr. Benjamine Mola who recommended antibiotics with clindamycin and transferred to [facility] for potentially surgery.  Patient today states that he has had some chills but no fevers, denies any chest pain, denies any shortness of breath, denies any nausea or vomiting.  ED Course: In the ED his vital signs are stable, BMP is unremarkable, lactic acid is normal, CBC shows a white count of 13.1.  CT scan as above, we are asked to admit and ENT will consult when patient gets to [facility].  Assessment/Plan:   Principal Problem:   Retropharyngeal abscess Active Problems:   Type 2 diabetes mellitus with  complication, without long-term current use of insulin (HCC)   HYPERTENSION, BENIGN   GERD  1. Retro-pharyngeal soft tissue inflammation: Initially suspected to be possible abscess based off CT imaging.  Patient was started on clindamycin and is currently on day #2. Direct laryngoscopy and esophagoscopy performed by Dr. Anthony Sar did not show any positive abscess. - Continue IV antibiotics as recommended  2. Essential hypertension - Restart atenolol and trandolapril  3. Diabetes mellitus type 2: Patient currently treated with oral agents. - Hypoglycemic protocols - CBGs q. before meals and at bedtime with sensitive sliding scale insulin  4. CAD: Stable  5.  Dyslipidemia - Continue fenofibric  6. GERD - Continue pharmacy substitution of Protonix   Body mass index is 26.59 kg/m.   Family Communication/Anticipated D/C date and plan/Code Status   DVT prophylaxis: Lovenox ordered. Code Status: Full Code.  Family Communication: Discussed plan of care with the patient and family present at bedside Disposition Plan: Possibly home tomorrow   Medical Consultants:    ENT Dr. Leta Baptist   Anti-Infectives:    Clindamycin, day 2  Subjective:   Patient reports that he feels like swelling is going down although he still has some soreness.  He thinks that he may have dislodged the toothpick he swallowed on his way to the hospital.  He underwent direct laryngoscopy and esophagoscopy earlier in the morning and they did not find any signs of an abscess  Objective:    Vitals:   03/06/18 1347 03/06/18 1856 03/06/18 2051 03/07/18 0207  BP: (!) 129/96 135/80 (!) 160/81 (!) 145/88  Pulse: 88 97 96 71  Resp: 17 16 18 18   Temp: 99.4 F (37.4 C) 97.7 F (36.5 C) 98.9 F (37.2 C) (!) 97.5 F (36.4 C)  TempSrc: Oral Oral Oral Oral  SpO2: 97% 97% 96% 98%  Weight:      Height:        Intake/Output Summary (Last 24 hours) at 03/07/2018 0407 Last data filed at 03/06/2018 1328 Gross per 24  hour  Intake 1813.94 ml  Output -  Net 1813.94 ml   Filed Weights   03/06/18 1052  Weight: 77 kg    Exam: Constitutional: NAD, calm, comfortable Eyes: PERRL, lids and conjunctivae normal ENMT: Mucous membranes are moist. Posterior pharynx clear of any exudate or lesions.Normal dentition.  Neck: normal range of motion with some tenderness to palpation of the submandibular glands.  No thyroid megaly noted. Respiratory: clear to auscultation bilaterally, no wheezing, no crackles. Normal respiratory effort. No accessory muscle use.  Cardiovascular: Regular rate and rhythm, no murmurs / rubs / gallops. No extremity edema. 2+ pedal pulses. No carotid bruits.  Abdomen: no tenderness, no masses palpated. No hepatosplenomegaly. Bowel sounds positive.  Musculoskeletal: no clubbing / cyanosis. No joint deformity upper and lower extremities. Good ROM, no contractures. Normal muscle tone.  Skin: no rashes, lesions, ulcers. No induration Neurologic: CN 2-12 grossly intact. Sensation intact, DTR normal. Strength 5/5 in all 4.  Psychiatric: Normal judgment and insight. Alert and oriented x 3. Normal mood.    Data Reviewed:   I have personally reviewed following labs and imaging studies:  Labs: Labs show the following:   Basic Metabolic Panel: Recent Labs  Lab 03/05/18 1327 03/06/18 0507  NA 133* 135  K 3.9 4.1  CL 101 105  CO2 22 19*  GLUCOSE 134* 99  BUN 19 13  CREATININE 0.90 0.88  CALCIUM 9.6 9.0   GFR Estimated Creatinine Clearance: 76.2 mL/min (by C-G formula based on SCr of 0.88 mg/dL). Liver Function Tests: Recent Labs  Lab 03/05/18 1327  AST 19  ALT 36  ALKPHOS 64  BILITOT 1.2  PROT 7.8  ALBUMIN 4.4   No results for input(s): LIPASE, AMYLASE in the last 168 hours. No results for input(s): AMMONIA in the last 168 hours. Coagulation profile No results for input(s): INR, PROTIME in the last 168 hours.  CBC: Recent Labs  Lab 03/05/18 1327 03/06/18 0507  WBC  13.1* 7.5  NEUTROABS 10.5*  --   HGB 16.3 14.1  HCT 48.4 43.0  MCV 88.2 87.4  PLT 256 203   Cardiac Enzymes: No results for input(s): CKTOTAL, CKMB, CKMBINDEX, TROPONINI in the last 168 hours. BNP (last 3 results) No results for input(s): PROBNP in the last 8760 hours. CBG: Recent Labs  Lab 03/06/18 0815 03/06/18 1024 03/06/18 1235 03/06/18 1755 03/06/18 2048  GLUCAP 116* 119* 100* 235* 416*   D-Dimer: No results for input(s): DDIMER in the last 72 hours. Hgb A1c: No results for input(s): HGBA1C in the last 72 hours. Lipid Profile: No results for input(s): CHOL, HDL, LDLCALC, TRIG, CHOLHDL, LDLDIRECT in the last 72 hours. Thyroid function studies: No results for input(s): TSH, T4TOTAL, T3FREE, THYROIDAB in the last 72 hours.  Invalid input(s): FREET3 Anemia work up: No results for input(s): VITAMINB12, FOLATE, FERRITIN, TIBC, IRON, RETICCTPCT in the last 72 hours. Sepsis Labs: Recent Labs  Lab 03/05/18 1327 03/05/18 1332 03/06/18 0507  WBC 13.1*  --  7.5  LATICACIDVEN  --  1.09  --     Microbiology Recent Results (from the past 240  hour(s))  Surgical pcr screen     Status: None   Collection Time: 03/06/18  8:22 AM  Result Value Ref Range Status   MRSA, PCR NEGATIVE NEGATIVE Final   Staphylococcus aureus NEGATIVE NEGATIVE Final    Comment: (NOTE) The Xpert SA Assay (FDA approved for NASAL specimens in patients 60 years of age and older), is one component of a comprehensive surveillance program. It is not intended to diagnose infection nor to guide or monitor treatment. Performed at [facility], [place] [address] [place], [place] [address]     Procedures and diagnostic studies:  Ct Soft Tissue Neck W Contrast  Result Date: 03/05/2018 CLINICAL DATA:  Patient swallowed tooth pick 3 days ago. Throat pain. Negative endoscopy exam. EXAM: CT NECK WITH CONTRAST TECHNIQUE: Multidetector CT imaging of the neck was performed using the standard protocol  following the bolus administration of intravenous contrast. CONTRAST:  41mL OMNIPAQUE IOHEXOL 300 MG/ML  SOLN COMPARISON:  None. FINDINGS: Pharynx and larynx: The patient shows evidence of inflammatory edema and stranding within the retropharyngeal soft tissues from the region of C2 to T1. Given the clinical history, this raises considerable concern regarding aero digestive tract injury from the swallowed tooth pick with regional inflammation. This appears to be phlegmonous inflammation at this time without a clearly defined low-density abscess. I am unable to clearly identify a foreign object. If this was a wooden tooth pick, wood often shows air density by CT. One could question a linear air shadow in the right-side of the esophagus or esophageal wall at the thoracic inlet level, but this is far from certain. Salivary glands: Parotid and submandibular glands are normal. Thyroid: Normal Lymph nodes: No enlarged or low-density nodes on either side of the neck. Vascular: Ordinary mild atherosclerosis of the carotid bifurcations. Limited intracranial: Negative Visualized orbits: Normal Mastoids and visualized paranasal sinuses: Clear Skeleton: Ordinary cervical spondylosis. Upper chest: Normal Other: None IMPRESSION: Inflammatory edema, stranding and swelling within the retropharyngeal soft tissues throughout the neck. With this clinical history, this raises considerable concern regarding aero digestive tract injury from the swallowed tooth pick described by history. This appears to represent phlegmonous inflammation without a discretely defined abscess. Wooden tooth picks can be very difficult to see by CT. They may show air density. I do not definitely identify that. One could question a linear air shadow in the right side of the esophagus or esophageal wall at the thoracic inlet level, but this is far from certain as there is some intraluminal air also in this region. Electronically Signed   By: Nelson Chimes M.D.    On: 03/05/2018 12:35    Medications:   . insulin aspart  0-9 Units Subcutaneous TID WC   Continuous Infusions: . sodium chloride 100 mL/hr at 03/06/18 0519  . clindamycin (CLEOCIN) IV 600 mg (03/07/18 0020)     LOS: 1 day    A   [facility]   *Please refer to [email or web address], password [email or web address] to get updated schedule on who will round on this patient, as hospitalists switch teams weekly. If 7PM-7AM, please contact night-coverage at [email or web address], password [email or web address] for any overnight needs.

## 2018-03-07 NOTE — Progress Notes (Signed)
Rolla Flatten to be D/C'd  per MD order. Discussed with the patient and all questions fully answered.  VSS, Skin clean, dry and intact without evidence of skin break down, no evidence of skin tears noted.  IV catheter discontinued intact. Site without signs and symptoms of complications. Dressing and pressure applied.  An After Visit Summary was printed and given to the patient. Patient received prescription.  D/c education completed with patient/family including follow up instructions, medication list, d/c activities limitations if indicated, with other d/c instructions as indicated by MD - patient able to verbalize understanding, all questions fully answered.   Patient instructed to return to ED, call 911, or call MD for any changes in condition.   Patient preferred to walk instead of wheelchair and went home via private auto.

## 2018-03-07 NOTE — Discharge Summary (Signed)
Discharge Summary  Chad Blair WFU:[id number] DOB: 28-Nov-1950  PCP: Sharilyn Sites, MD  Admit date: 03/05/2018 Discharge date: 03/07/2018  Time spent: 30 minutes  Recommendations for Outpatient Follow-up:  1. Complete antibiotics p.o. 2. Follow-up PCP  Discharge Diagnoses:  Active Hospital Problems   Diagnosis Date Noted  . Retropharyngeal abscess 03/05/2018  . Coronary artery disease 11/27/2010  . HYPERTENSION, BENIGN 12/16/2008  . Type 2 diabetes mellitus with complication, without long-term current use of insulin ([place]) 11/21/2008  . GERD 11/21/2008  . Dyslipidemia 11/21/2008    Resolved Hospital Problems  No resolved problems to display.    Discharge Condition: Approved  Diet recommendation: Diabetic heart healthy  Vitals:   03/07/18 0457 03/07/18 0916  BP: (!) 154/82 (!) 158/87  Pulse: 72 74  Resp: 19   Temp: 97.9 F (36.6 C)   SpO2: 98%     History of present illness:  Patient admitted with possible swallowing of foreign body he thought it was a toothpick and there was a consideration for an abscess retropharyngeal area there was inflammation of the soft tissue in that same area ENT was consulted he underwent direct vision laryngoscopy which did not reveal any foreign body or abscess.  Patient was started on empiric antibiotic with clindamycin IV.  He was switched to p.o. antibiotics with doxycycline because his insurance would not cover p.o. clindamycin he will complete therefore 5 days due to the erythema that was seen on laryngoscopy  Hospital Course:  Principal Problem:   Retropharyngeal abscess Active Problems:   Type 2 diabetes mellitus with complication, without long-term current use of insulin (HCC)   Dyslipidemia   HYPERTENSION, BENIGN   GERD   Coronary artery disease  Patient admitted with possible swallowing of foreign body he thought it was a toothpick and there was a consideration for an abscess retropharyngeal area there was inflammation of the  soft tissue in that same area ENT was consulted he underwent direct vision laryngoscopy which did not reveal any foreign body or abscess.  Patient was started on empiric antibiotic with clindamycin IV.  He was switched to p.o. antibiotics with doxycycline because his insurance would not cover p.o. clindamycin he will complete therefore 5 days due to the erythema that was seen on laryngoscopy    Procedures:  Laryngoscopy  Consultations:  ENT Dr. Benjamine Mola  Discharge Exam: BP (!) 158/87   Pulse 74   Temp 97.9 F (36.6 C) (Oral)   Resp 19   Ht 5\' 7"  (1.702 m)   Wt 77 kg   SpO2 98%   BMI 26.59 kg/m   General: Pleasant no distress well-nourished Cardiovascular:  regular rate and rhythm Respiratory: No distress clear to walker auscultation bilaterally.  Discharge Instructions You were cared for by a hospitalist during your hospital stay. If you have any questions about your discharge medications or the care you received while you were in the hospital after you are discharged, you can call the unit and asked to speak with the hospitalist on call if the hospitalist that took care of you is not available. Once you are discharged, your primary care physician will handle any further medical issues. Please note that NO REFILLS for any discharge medications will be authorized once you are discharged, as it is imperative that you return to your primary care physician (or establish a relationship with a primary care physician if you do not have one) for your aftercare needs so that they can reassess your need for medications and monitor your  lab values.  Discharge Instructions    Call MD for:  severe uncontrolled pain   Complete by:  As directed    Call MD for:  temperature >100.4   Complete by:  As directed    Diet Carb Modified   Complete by:  As directed    Discharge instructions   Complete by:  As directed    Soft diet and advance to regulAR CONSITENCY  TOMORROW. LOW CALORIE DIET FOR DIABETIC    Increase activity slowly   Complete by:  As directed      Allergies as of 03/07/2018   No Known Allergies     Medication List    TAKE these medications   aspirin 81 MG tablet Take 81 mg by mouth daily.   atenolol 50 MG tablet Commonly known as:  TENORMIN TAKE 1 TABLET DAILY   atorvastatin 80 MG tablet Commonly known as:  LIPITOR TAKE 1 TABLET DAILY What changed:  when to take this   doxycycline 50 MG capsule Commonly known as:  VIBRAMYCIN Take 2 capsules (100 mg total) by mouth 2 (two) times daily.   FARXIGA 5 MG Tabs tablet Generic drug:  dapagliflozin propanediol Take 2.5 mg by mouth 2 (two) times daily.   fenofibrate 145 MG tablet Commonly known as:  TRICOR TAKE 1 TABLET DAILY What changed:  when to take this   glimepiride 2 MG tablet Commonly known as:  AMARYL Take 2 mg by mouth 2 (two) times daily.   MULTI FOR HIM PO Take 1 tablet by mouth daily.   nabumetone 750 MG tablet Commonly known as:  RELAFEN Take 750 mg by mouth 2 (two) times daily. For arthritis   omeprazole 40 MG capsule Commonly known as:  PRILOSEC Take 40 mg by mouth every morning.   sitaGLIPtin-metformin 50-1000 MG tablet Commonly known as:  JANUMET Take 1 tablet by mouth 2 (two) times daily with a meal.   trandolapril 4 MG tablet Commonly known as:  [place] 1 TABLET DAILY What changed:  when to take this      No Known Allergies    The results of significant diagnostics from this hospitalization (including imaging, microbiology, ancillary and laboratory) are listed below for reference.    Significant Diagnostic Studies: Ct Soft Tissue Neck W Contrast  Result Date: 03/05/2018 CLINICAL DATA:  Patient swallowed tooth pick 3 days ago. Throat pain. Negative endoscopy exam. EXAM: CT NECK WITH CONTRAST TECHNIQUE: Multidetector CT imaging of the neck was performed using the standard protocol following the bolus administration of intravenous contrast. CONTRAST:  20mL OMNIPAQUE  IOHEXOL 300 MG/ML  SOLN COMPARISON:  None. FINDINGS: Pharynx and larynx: The patient shows evidence of inflammatory edema and stranding within the retropharyngeal soft tissues from the region of C2 to T1. Given the clinical history, this raises considerable concern regarding aero digestive tract injury from the swallowed tooth pick with regional inflammation. This appears to be phlegmonous inflammation at this time without a clearly defined low-density abscess. I am unable to clearly identify a foreign object. If this was a wooden tooth pick, wood often shows air density by CT. One could question a linear air shadow in the right-side of the esophagus or esophageal wall at the thoracic inlet level, but this is far from certain. Salivary glands: Parotid and submandibular glands are normal. Thyroid: Normal Lymph nodes: No enlarged or low-density nodes on either side of the neck. Vascular: Ordinary mild atherosclerosis of the carotid bifurcations. Limited intracranial: Negative Visualized orbits: Normal Mastoids and  visualized paranasal sinuses: Clear Skeleton: Ordinary cervical spondylosis. Upper chest: Normal Other: None IMPRESSION: Inflammatory edema, stranding and swelling within the retropharyngeal soft tissues throughout the neck. With this clinical history, this raises considerable concern regarding aero digestive tract injury from the swallowed tooth pick described by history. This appears to represent phlegmonous inflammation without a discretely defined abscess. Wooden tooth picks can be very difficult to see by CT. They may show air density. I do not definitely identify that. One could question a linear air shadow in the right side of the esophagus or esophageal wall at the thoracic inlet level, but this is far from certain as there is some intraluminal air also in this region. Electronically Signed   By: Nelson Chimes M.D.   On: 03/05/2018 12:35    Microbiology: Recent Results (from the past 240 hour(s))    Surgical pcr screen     Status: None   Collection Time: 03/06/18  8:22 AM  Result Value Ref Range Status   MRSA, PCR NEGATIVE NEGATIVE Final   Staphylococcus aureus NEGATIVE NEGATIVE Final    Comment: (NOTE) The Xpert SA Assay (FDA approved for NASAL specimens in patients 62 years of age and older), is one component of a comprehensive surveillance program. It is not intended to diagnose infection nor to guide or monitor treatment. Performed at [facility], [address], [place], [place] [id number]      Labs: Basic Metabolic Panel: Recent Labs  Lab 03/05/18 1327 03/06/18 0507  NA 133* 135  K 3.9 4.1  CL 101 105  CO2 22 19*  GLUCOSE 134* 99  BUN 19 13  CREATININE 0.90 0.88  CALCIUM 9.6 9.0   Liver Function Tests: Recent Labs  Lab 03/05/18 1327  AST 19  ALT 36  ALKPHOS 64  BILITOT 1.2  PROT 7.8  ALBUMIN 4.4   No results for input(s): LIPASE, AMYLASE in the last 168 hours. No results for input(s): AMMONIA in the last 168 hours. CBC: Recent Labs  Lab 03/05/18 1327 03/06/18 0507  WBC 13.1* 7.5  NEUTROABS 10.5*  --   HGB 16.3 14.1  HCT 48.4 43.0  MCV 88.2 87.4  PLT 256 203   Cardiac Enzymes: No results for input(s): CKTOTAL, CKMB, CKMBINDEX, TROPONINI in the last 168 hours. BNP: BNP (last 3 results) No results for input(s): BNP in the last 8760 hours.  ProBNP (last 3 results) No results for input(s): PROBNP in the last 8760 hours.  CBG: Recent Labs  Lab 03/06/18 1235 03/06/18 1755 03/06/18 2048 03/07/18 0451 03/07/18 0838  GLUCAP 100* 235* 416* 160* 149*       Signed:  Cristal Deer, MD [facility] 03/07/2018, 10:20 AM

## 2018-03-07 NOTE — Progress Notes (Signed)
Subjective: Pt reports significant improvement in his sore throat.  Objective: Vital signs in last 24 hours: Temp:  [97.5 F (36.4 C)-99.4 F (37.4 C)] 97.9 F (36.6 C) (01/11 0457) Pulse Rate:  [71-97] 74 (01/11 0916) Resp:  [16-19] 19 (01/11 0457) BP: (129-160)/(80-96) 158/87 (01/11 0916) SpO2:  [96 %-100 %] 98 % (01/11 0457) Weight:  [77 kg] 77 kg (01/10 1052)  Physical Exam: General: Communicates without difficulty, well nourished, no acute distress.  Head: Normocephalic, no evidence injury, no tenderness, facial buttresses intact without stepoff.  Eyes: PERRL, EOMI.  No scleral icterus, conjunctivae clear.  Ears: External auditory canals clear bilaterally.  There is no edema or erythema.  Tympanic membrane is within normal limits bilaterally.  Nose: Normal skin and external support.  Anterior rhinoscopy reveals healthy pink mucosa over the septum and turbinates.  No lesions or polyps were seen.  Oral cavity: Lips without lesions, oral mucosa moist, no masses or lesions seen.  Neck: Supple, full range of motion, no lymphadenopathy, no masses palpable.  Salivary: Parotid and submandibular glands without mass.    Recent Labs    03/05/18 1327 03/06/18 0507  WBC 13.1* 7.5  HGB 16.3 14.1  HCT 48.4 43.0  PLT 256 203   Recent Labs    03/05/18 1327 03/06/18 0507  NA 133* 135  K 3.9 4.1  CL 101 105  CO2 22 19*  GLUCOSE 134* 99  BUN 19 13  CREATININE 0.90 0.88  CALCIUM 9.6 9.0    Medications:  I have reviewed the patient's current medications. Scheduled: . aspirin  81 mg Oral Daily  . atenolol  50 mg Oral Daily  . enoxaparin (LOVENOX) injection  40 mg Subcutaneous Q24H  . fenofibrate  54 mg Oral Daily  . insulin aspart  0-15 Units Subcutaneous Q4H  . nabumetone  750 mg Oral BID  . pantoprazole  40 mg Oral Daily  . trandolapril  4 mg Oral q morning - 10a   Continuous: . clindamycin (CLEOCIN) IV Stopped (03/07/18 0531)    Assessment/Plan: Retropharyngeal  phlegmon s/p foreign body ingestion. - Clinically improved. - May d/c home with oral clindamycin (e.g. 300mg  po QID for 7 days). - Pt will follow up with me as an outpatient as scheduled.   LOS: 1 day   Mckala Pantaleon W Werner Labella 03/07/2018, 10:29 AM

## 2018-03-12 DIAGNOSIS — Z1283 Encounter for screening for malignant neoplasm of skin: Secondary | ICD-10-CM | POA: Diagnosis not present

## 2018-03-12 DIAGNOSIS — L570 Actinic keratosis: Secondary | ICD-10-CM | POA: Diagnosis not present

## 2018-03-12 DIAGNOSIS — Z85820 Personal history of malignant melanoma of skin: Secondary | ICD-10-CM | POA: Diagnosis not present

## 2018-03-12 DIAGNOSIS — X32XXXD Exposure to sunlight, subsequent encounter: Secondary | ICD-10-CM | POA: Diagnosis not present

## 2018-03-12 DIAGNOSIS — D225 Melanocytic nevi of trunk: Secondary | ICD-10-CM | POA: Diagnosis not present

## 2018-03-12 DIAGNOSIS — Z08 Encounter for follow-up examination after completed treatment for malignant neoplasm: Secondary | ICD-10-CM | POA: Diagnosis not present

## 2018-03-19 ENCOUNTER — Ambulatory Visit (INDEPENDENT_AMBULATORY_CARE_PROVIDER_SITE_OTHER): Payer: Medicare Other | Admitting: Otolaryngology

## 2018-03-19 DIAGNOSIS — R070 Pain in throat: Secondary | ICD-10-CM

## 2018-03-27 ENCOUNTER — Encounter: Payer: Self-pay | Admitting: Cardiology

## 2018-03-27 ENCOUNTER — Ambulatory Visit (INDEPENDENT_AMBULATORY_CARE_PROVIDER_SITE_OTHER): Payer: Medicare Other | Admitting: Cardiology

## 2018-03-27 VITALS — BP 136/79 | HR 78 | Ht 67.0 in | Wt 170.0 lb

## 2018-03-27 DIAGNOSIS — E119 Type 2 diabetes mellitus without complications: Secondary | ICD-10-CM | POA: Diagnosis not present

## 2018-03-27 DIAGNOSIS — I2510 Atherosclerotic heart disease of native coronary artery without angina pectoris: Secondary | ICD-10-CM

## 2018-03-27 DIAGNOSIS — I10 Essential (primary) hypertension: Secondary | ICD-10-CM | POA: Diagnosis not present

## 2018-03-27 DIAGNOSIS — E782 Mixed hyperlipidemia: Secondary | ICD-10-CM | POA: Diagnosis not present

## 2018-03-27 NOTE — Patient Instructions (Signed)
Medication Instructions:  Your physician recommends that you continue on your current medications as directed. Please refer to the Current Medication list given to you today.  If you need a refill on your cardiac medications before your next appointment, please call your pharmacy.   Lab work: NONE  If you have labs (blood work) drawn today and your tests are completely normal, you will receive your results only by: . MyChart Message (if you have MyChart) OR . A paper copy in the mail If you have any lab test that is abnormal or we need to change your treatment, we will call you to review the results.  Testing/Procedures: NONE   Follow-Up: At [facility], you and your health needs are our priority.  As part of our continuing mission to provide you with exceptional heart care, we have created designated Provider Care Teams.  These Care Teams include your primary Cardiologist (physician) and Advanced Practice Providers (APPs -  Physician Assistants and Nurse Practitioners) who all work together to provide you with the care you need, when you need it. You will need a follow up appointment in 1 years.  Please call our office 2 months in advance to schedule this appointment.  You may see Branch, Jonathan, MD or one of the following Advanced Practice Providers on your designated Care Team:   Brittany Strader, PA-C ([place] Office) . Michele Lenze, PA-C ([place] Office)  Any Other Special Instructions Will Be Listed Below (If Applicable). Thank you for choosing [facility]!     
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
## 2018-03-29 ENCOUNTER — Encounter: Payer: Self-pay | Admitting: Cardiology

## 2018-04-02 ENCOUNTER — Other Ambulatory Visit: Payer: Self-pay | Admitting: Cardiology

## 2018-05-20 DIAGNOSIS — I10 Essential (primary) hypertension: Secondary | ICD-10-CM | POA: Diagnosis not present

## 2018-05-20 DIAGNOSIS — E7800 Pure hypercholesterolemia, unspecified: Secondary | ICD-10-CM | POA: Diagnosis not present

## 2018-05-20 DIAGNOSIS — L239 Allergic contact dermatitis, unspecified cause: Secondary | ICD-10-CM | POA: Diagnosis not present

## 2018-05-20 DIAGNOSIS — E1165 Type 2 diabetes mellitus with hyperglycemia: Secondary | ICD-10-CM | POA: Diagnosis not present

## 2018-06-01 DIAGNOSIS — E1165 Type 2 diabetes mellitus with hyperglycemia: Secondary | ICD-10-CM | POA: Diagnosis not present

## 2018-06-01 DIAGNOSIS — E7800 Pure hypercholesterolemia, unspecified: Secondary | ICD-10-CM | POA: Diagnosis not present

## 2018-06-01 DIAGNOSIS — I10 Essential (primary) hypertension: Secondary | ICD-10-CM | POA: Diagnosis not present

## 2018-06-12 ENCOUNTER — Other Ambulatory Visit: Payer: Self-pay | Admitting: Cardiology

## 2018-06-28 ENCOUNTER — Other Ambulatory Visit: Payer: Self-pay | Admitting: Cardiology

## 2018-07-23 DIAGNOSIS — Z681 Body mass index (BMI) 19 or less, adult: Secondary | ICD-10-CM | POA: Diagnosis not present

## 2018-07-23 DIAGNOSIS — Z0001 Encounter for general adult medical examination with abnormal findings: Secondary | ICD-10-CM | POA: Diagnosis not present

## 2018-07-23 DIAGNOSIS — Z1389 Encounter for screening for other disorder: Secondary | ICD-10-CM | POA: Diagnosis not present

## 2018-08-06 ENCOUNTER — Other Ambulatory Visit: Payer: Self-pay | Admitting: Cardiology

## 2018-08-20 DIAGNOSIS — H2513 Age-related nuclear cataract, bilateral: Secondary | ICD-10-CM | POA: Diagnosis not present

## 2018-08-20 DIAGNOSIS — E119 Type 2 diabetes mellitus without complications: Secondary | ICD-10-CM | POA: Diagnosis not present

## 2018-08-20 DIAGNOSIS — H25013 Cortical age-related cataract, bilateral: Secondary | ICD-10-CM | POA: Diagnosis not present

## 2018-08-20 DIAGNOSIS — H5211 Myopia, right eye: Secondary | ICD-10-CM | POA: Diagnosis not present

## 2018-08-24 ENCOUNTER — Other Ambulatory Visit: Payer: Self-pay | Admitting: *Deleted

## 2018-09-03 ENCOUNTER — Other Ambulatory Visit: Payer: Self-pay | Admitting: *Deleted

## 2018-09-03 ENCOUNTER — Other Ambulatory Visit: Payer: Self-pay

## 2018-09-03 MED ORDER — ATENOLOL 50 MG PO TABS: 50.0000 mg | ORAL_TABLET | Freq: Every day | ORAL | 3 refills | Status: DC

## 2018-09-03 MED ORDER — ATORVASTATIN CALCIUM 80 MG PO TABS: 80.0000 mg | ORAL_TABLET | Freq: Every day | ORAL | 1 refills | Status: DC

## 2018-09-07 ENCOUNTER — Other Ambulatory Visit: Payer: Self-pay | Admitting: Cardiology

## 2018-09-07 MED ORDER — ATORVASTATIN CALCIUM 80 MG PO TABS: 80.0000 mg | ORAL_TABLET | Freq: Every day | ORAL | 1 refills | Status: DC

## 2018-09-07 NOTE — Telephone Encounter (Signed)
Requesting refill on Atorvastatin 90 day supply to [facility] on [address] / tg

## 2018-09-07 NOTE — Telephone Encounter (Signed)
Refilled atorvastatin

## 2018-10-14 DIAGNOSIS — I10 Essential (primary) hypertension: Secondary | ICD-10-CM | POA: Diagnosis not present

## 2018-10-14 DIAGNOSIS — Z6826 Body mass index (BMI) 26.0-26.9, adult: Secondary | ICD-10-CM | POA: Diagnosis not present

## 2018-10-14 DIAGNOSIS — E7849 Other hyperlipidemia: Secondary | ICD-10-CM | POA: Diagnosis not present

## 2018-10-14 DIAGNOSIS — E119 Type 2 diabetes mellitus without complications: Secondary | ICD-10-CM | POA: Diagnosis not present

## 2018-10-14 DIAGNOSIS — Z23 Encounter for immunization: Secondary | ICD-10-CM | POA: Diagnosis not present

## 2018-10-14 DIAGNOSIS — I2510 Atherosclerotic heart disease of native coronary artery without angina pectoris: Secondary | ICD-10-CM | POA: Diagnosis not present

## 2018-10-15 DIAGNOSIS — E119 Type 2 diabetes mellitus without complications: Secondary | ICD-10-CM | POA: Diagnosis not present

## 2018-10-15 DIAGNOSIS — Z6826 Body mass index (BMI) 26.0-26.9, adult: Secondary | ICD-10-CM | POA: Diagnosis not present

## 2018-10-15 DIAGNOSIS — Z125 Encounter for screening for malignant neoplasm of prostate: Secondary | ICD-10-CM | POA: Diagnosis not present

## 2018-10-15 DIAGNOSIS — E7849 Other hyperlipidemia: Secondary | ICD-10-CM | POA: Diagnosis not present

## 2018-10-15 DIAGNOSIS — Z23 Encounter for immunization: Secondary | ICD-10-CM | POA: Diagnosis not present

## 2018-10-15 DIAGNOSIS — E663 Overweight: Secondary | ICD-10-CM | POA: Diagnosis not present

## 2018-10-30 DIAGNOSIS — E7849 Other hyperlipidemia: Secondary | ICD-10-CM | POA: Diagnosis not present

## 2018-10-30 DIAGNOSIS — M2550 Pain in unspecified joint: Secondary | ICD-10-CM | POA: Diagnosis not present

## 2018-10-30 DIAGNOSIS — I2510 Atherosclerotic heart disease of native coronary artery without angina pectoris: Secondary | ICD-10-CM | POA: Diagnosis not present

## 2018-10-30 DIAGNOSIS — Z6826 Body mass index (BMI) 26.0-26.9, adult: Secondary | ICD-10-CM | POA: Diagnosis not present

## 2018-10-30 DIAGNOSIS — K219 Gastro-esophageal reflux disease without esophagitis: Secondary | ICD-10-CM | POA: Diagnosis not present

## 2018-10-30 DIAGNOSIS — Z23 Encounter for immunization: Secondary | ICD-10-CM | POA: Diagnosis not present

## 2018-10-30 DIAGNOSIS — M353 Polymyalgia rheumatica: Secondary | ICD-10-CM | POA: Diagnosis not present

## 2018-10-30 DIAGNOSIS — I10 Essential (primary) hypertension: Secondary | ICD-10-CM | POA: Diagnosis not present

## 2018-11-17 DIAGNOSIS — Z23 Encounter for immunization: Secondary | ICD-10-CM | POA: Diagnosis not present

## 2018-11-19 DIAGNOSIS — L570 Actinic keratosis: Secondary | ICD-10-CM | POA: Diagnosis not present

## 2018-11-19 DIAGNOSIS — D225 Melanocytic nevi of trunk: Secondary | ICD-10-CM | POA: Diagnosis not present

## 2018-11-19 DIAGNOSIS — Z1283 Encounter for screening for malignant neoplasm of skin: Secondary | ICD-10-CM | POA: Diagnosis not present

## 2018-11-19 DIAGNOSIS — X32XXXD Exposure to sunlight, subsequent encounter: Secondary | ICD-10-CM | POA: Diagnosis not present

## 2018-11-19 DIAGNOSIS — Z85820 Personal history of malignant melanoma of skin: Secondary | ICD-10-CM | POA: Diagnosis not present

## 2018-11-19 DIAGNOSIS — Z08 Encounter for follow-up examination after completed treatment for malignant neoplasm: Secondary | ICD-10-CM | POA: Diagnosis not present

## 2018-11-30 DIAGNOSIS — E7800 Pure hypercholesterolemia, unspecified: Secondary | ICD-10-CM | POA: Diagnosis not present

## 2018-11-30 DIAGNOSIS — E1165 Type 2 diabetes mellitus with hyperglycemia: Secondary | ICD-10-CM | POA: Diagnosis not present

## 2018-11-30 DIAGNOSIS — I10 Essential (primary) hypertension: Secondary | ICD-10-CM | POA: Diagnosis not present

## 2018-12-14 DIAGNOSIS — M9903 Segmental and somatic dysfunction of lumbar region: Secondary | ICD-10-CM | POA: Diagnosis not present

## 2018-12-14 DIAGNOSIS — M5441 Lumbago with sciatica, right side: Secondary | ICD-10-CM | POA: Diagnosis not present

## 2018-12-15 ENCOUNTER — Other Ambulatory Visit: Payer: Self-pay | Admitting: Cardiology

## 2018-12-15 DIAGNOSIS — M9903 Segmental and somatic dysfunction of lumbar region: Secondary | ICD-10-CM | POA: Diagnosis not present

## 2018-12-15 DIAGNOSIS — M5441 Lumbago with sciatica, right side: Secondary | ICD-10-CM | POA: Diagnosis not present

## 2018-12-16 DIAGNOSIS — M9903 Segmental and somatic dysfunction of lumbar region: Secondary | ICD-10-CM | POA: Diagnosis not present

## 2018-12-16 DIAGNOSIS — M5441 Lumbago with sciatica, right side: Secondary | ICD-10-CM | POA: Diagnosis not present

## 2018-12-23 ENCOUNTER — Other Ambulatory Visit: Payer: Self-pay

## 2018-12-23 DIAGNOSIS — Z20828 Contact with and (suspected) exposure to other viral communicable diseases: Secondary | ICD-10-CM | POA: Diagnosis not present

## 2018-12-23 DIAGNOSIS — Z20822 Contact with and (suspected) exposure to covid-19: Secondary | ICD-10-CM

## 2018-12-23 DIAGNOSIS — M5441 Lumbago with sciatica, right side: Secondary | ICD-10-CM | POA: Diagnosis not present

## 2018-12-23 DIAGNOSIS — M9903 Segmental and somatic dysfunction of lumbar region: Secondary | ICD-10-CM | POA: Diagnosis not present

## 2018-12-24 LAB — NOVEL CORONAVIRUS, NAA: SARS-CoV-2, NAA: DETECTED — AB

## 2019-01-12 DIAGNOSIS — M9903 Segmental and somatic dysfunction of lumbar region: Secondary | ICD-10-CM | POA: Diagnosis not present

## 2019-01-12 DIAGNOSIS — M5441 Lumbago with sciatica, right side: Secondary | ICD-10-CM | POA: Diagnosis not present

## 2019-01-15 DIAGNOSIS — M9903 Segmental and somatic dysfunction of lumbar region: Secondary | ICD-10-CM | POA: Diagnosis not present

## 2019-01-15 DIAGNOSIS — M5441 Lumbago with sciatica, right side: Secondary | ICD-10-CM | POA: Diagnosis not present

## 2019-01-18 DIAGNOSIS — M5441 Lumbago with sciatica, right side: Secondary | ICD-10-CM | POA: Diagnosis not present

## 2019-01-18 DIAGNOSIS — M9903 Segmental and somatic dysfunction of lumbar region: Secondary | ICD-10-CM | POA: Diagnosis not present

## 2019-01-25 DIAGNOSIS — M1990 Unspecified osteoarthritis, unspecified site: Secondary | ICD-10-CM | POA: Diagnosis not present

## 2019-01-25 DIAGNOSIS — I10 Essential (primary) hypertension: Secondary | ICD-10-CM | POA: Diagnosis not present

## 2019-01-25 DIAGNOSIS — E119 Type 2 diabetes mellitus without complications: Secondary | ICD-10-CM | POA: Diagnosis not present

## 2019-01-25 DIAGNOSIS — E7849 Other hyperlipidemia: Secondary | ICD-10-CM | POA: Diagnosis not present

## 2019-02-13 ENCOUNTER — Other Ambulatory Visit: Payer: Self-pay | Admitting: Cardiology

## 2019-02-25 DIAGNOSIS — M1990 Unspecified osteoarthritis, unspecified site: Secondary | ICD-10-CM | POA: Diagnosis not present

## 2019-02-25 DIAGNOSIS — E119 Type 2 diabetes mellitus without complications: Secondary | ICD-10-CM | POA: Diagnosis not present

## 2019-02-25 DIAGNOSIS — Z7984 Long term (current) use of oral hypoglycemic drugs: Secondary | ICD-10-CM | POA: Diagnosis not present

## 2019-02-25 DIAGNOSIS — I10 Essential (primary) hypertension: Secondary | ICD-10-CM | POA: Diagnosis not present

## 2019-03-04 DIAGNOSIS — D485 Neoplasm of uncertain behavior of skin: Secondary | ICD-10-CM | POA: Diagnosis not present

## 2019-03-04 DIAGNOSIS — Z85820 Personal history of malignant melanoma of skin: Secondary | ICD-10-CM | POA: Diagnosis not present

## 2019-03-04 DIAGNOSIS — Z08 Encounter for follow-up examination after completed treatment for malignant neoplasm: Secondary | ICD-10-CM | POA: Diagnosis not present

## 2019-03-04 DIAGNOSIS — Z1283 Encounter for screening for malignant neoplasm of skin: Secondary | ICD-10-CM | POA: Diagnosis not present

## 2019-03-15 ENCOUNTER — Other Ambulatory Visit: Payer: Self-pay | Admitting: Cardiology

## 2019-03-28 DIAGNOSIS — E7849 Other hyperlipidemia: Secondary | ICD-10-CM | POA: Diagnosis not present

## 2019-03-28 DIAGNOSIS — E119 Type 2 diabetes mellitus without complications: Secondary | ICD-10-CM | POA: Diagnosis not present

## 2019-03-28 DIAGNOSIS — M1990 Unspecified osteoarthritis, unspecified site: Secondary | ICD-10-CM | POA: Diagnosis not present

## 2019-03-28 DIAGNOSIS — I10 Essential (primary) hypertension: Secondary | ICD-10-CM | POA: Diagnosis not present

## 2019-04-19 DIAGNOSIS — E119 Type 2 diabetes mellitus without complications: Secondary | ICD-10-CM | POA: Diagnosis not present

## 2019-04-25 ENCOUNTER — Ambulatory Visit: Payer: Medicare Other | Attending: Internal Medicine

## 2019-04-25 DIAGNOSIS — Z23 Encounter for immunization: Secondary | ICD-10-CM | POA: Insufficient documentation

## 2019-04-25 DIAGNOSIS — E7849 Other hyperlipidemia: Secondary | ICD-10-CM | POA: Diagnosis not present

## 2019-04-25 DIAGNOSIS — I10 Essential (primary) hypertension: Secondary | ICD-10-CM | POA: Diagnosis not present

## 2019-04-25 DIAGNOSIS — E119 Type 2 diabetes mellitus without complications: Secondary | ICD-10-CM | POA: Diagnosis not present

## 2019-04-25 DIAGNOSIS — M1990 Unspecified osteoarthritis, unspecified site: Secondary | ICD-10-CM | POA: Diagnosis not present

## 2019-04-25 NOTE — Progress Notes (Signed)
   Covid-19 Vaccination Clinic  Name:  Chad Blair    MRN: [id number] DOB: Aug 19, 1950  04/25/2019  Chad Blair was observed post Covid-19 immunization for 15 minutes without incidence. He was provided with Vaccine Information Sheet and instruction to access the V-Safe system.   Chad Blair was instructed to call 911 with any severe reactions post vaccine: Marland Kitchen Difficulty breathing  . Swelling of your face and throat  . A fast heartbeat  . A bad rash all over your body  . Dizziness and weakness    Immunizations Administered    Name Date Dose VIS Date Route   Moderna COVID-19 Vaccine 04/25/2019  1:20 PM 0.5 mL 01/26/2019 Intramuscular   Manufacturer: Moderna   Lot: [id number]   [id number]

## 2019-05-10 DIAGNOSIS — M545 Low back pain: Secondary | ICD-10-CM | POA: Diagnosis not present

## 2019-05-10 DIAGNOSIS — M47816 Spondylosis without myelopathy or radiculopathy, lumbar region: Secondary | ICD-10-CM | POA: Diagnosis not present

## 2019-05-10 DIAGNOSIS — M9903 Segmental and somatic dysfunction of lumbar region: Secondary | ICD-10-CM | POA: Diagnosis not present

## 2019-05-12 DIAGNOSIS — M47816 Spondylosis without myelopathy or radiculopathy, lumbar region: Secondary | ICD-10-CM | POA: Diagnosis not present

## 2019-05-12 DIAGNOSIS — M545 Low back pain: Secondary | ICD-10-CM | POA: Diagnosis not present

## 2019-05-12 DIAGNOSIS — M9903 Segmental and somatic dysfunction of lumbar region: Secondary | ICD-10-CM | POA: Diagnosis not present

## 2019-05-14 DIAGNOSIS — M47816 Spondylosis without myelopathy or radiculopathy, lumbar region: Secondary | ICD-10-CM | POA: Diagnosis not present

## 2019-05-14 DIAGNOSIS — M9903 Segmental and somatic dysfunction of lumbar region: Secondary | ICD-10-CM | POA: Diagnosis not present

## 2019-05-14 DIAGNOSIS — M545 Low back pain: Secondary | ICD-10-CM | POA: Diagnosis not present

## 2019-05-18 DIAGNOSIS — M545 Low back pain: Secondary | ICD-10-CM | POA: Diagnosis not present

## 2019-05-18 DIAGNOSIS — M9903 Segmental and somatic dysfunction of lumbar region: Secondary | ICD-10-CM | POA: Diagnosis not present

## 2019-05-18 DIAGNOSIS — M47816 Spondylosis without myelopathy or radiculopathy, lumbar region: Secondary | ICD-10-CM | POA: Diagnosis not present

## 2019-05-25 DIAGNOSIS — M9903 Segmental and somatic dysfunction of lumbar region: Secondary | ICD-10-CM | POA: Diagnosis not present

## 2019-05-25 DIAGNOSIS — M545 Low back pain: Secondary | ICD-10-CM | POA: Diagnosis not present

## 2019-05-25 DIAGNOSIS — M47816 Spondylosis without myelopathy or radiculopathy, lumbar region: Secondary | ICD-10-CM | POA: Diagnosis not present

## 2019-05-26 DIAGNOSIS — E119 Type 2 diabetes mellitus without complications: Secondary | ICD-10-CM | POA: Diagnosis not present

## 2019-05-26 DIAGNOSIS — M1990 Unspecified osteoarthritis, unspecified site: Secondary | ICD-10-CM | POA: Diagnosis not present

## 2019-05-26 DIAGNOSIS — I10 Essential (primary) hypertension: Secondary | ICD-10-CM | POA: Diagnosis not present

## 2019-05-26 DIAGNOSIS — E7849 Other hyperlipidemia: Secondary | ICD-10-CM | POA: Diagnosis not present

## 2019-05-29 ENCOUNTER — Ambulatory Visit: Payer: Medicare Other | Attending: Internal Medicine

## 2019-05-29 DIAGNOSIS — Z23 Encounter for immunization: Secondary | ICD-10-CM

## 2019-05-29 NOTE — Progress Notes (Signed)
   Covid-19 Vaccination Clinic  Name:  Chad Blair    MRN: [id number] DOB: Dec 18, 1950  05/29/2019  Chad Blair was observed post Covid-19 immunization for 15 minutes without incident. He was provided with Vaccine Information Sheet and instruction to access the V-Safe system.   Chad Blair was instructed to call 911 with any severe reactions post vaccine: Marland Kitchen Difficulty breathing  . Swelling of face and throat  . A fast heartbeat  . A bad rash all over body  . Dizziness and weakness   Immunizations Administered    Name Date Dose VIS Date Route   Moderna COVID-19 Vaccine 05/29/2019  1:09 PM 0.5 mL 01/26/2019 Intramuscular   Manufacturer: Moderna   Lot: [id number]   [id number]

## 2019-05-31 DIAGNOSIS — M791 Myalgia, unspecified site: Secondary | ICD-10-CM | POA: Diagnosis not present

## 2019-05-31 DIAGNOSIS — R5383 Other fatigue: Secondary | ICD-10-CM | POA: Diagnosis not present

## 2019-05-31 DIAGNOSIS — I10 Essential (primary) hypertension: Secondary | ICD-10-CM | POA: Diagnosis not present

## 2019-05-31 DIAGNOSIS — E7800 Pure hypercholesterolemia, unspecified: Secondary | ICD-10-CM | POA: Diagnosis not present

## 2019-05-31 DIAGNOSIS — Z79899 Other long term (current) drug therapy: Secondary | ICD-10-CM | POA: Diagnosis not present

## 2019-05-31 DIAGNOSIS — E1165 Type 2 diabetes mellitus with hyperglycemia: Secondary | ICD-10-CM | POA: Diagnosis not present

## 2019-06-01 ENCOUNTER — Other Ambulatory Visit: Payer: Self-pay

## 2019-06-01 DIAGNOSIS — M47816 Spondylosis without myelopathy or radiculopathy, lumbar region: Secondary | ICD-10-CM | POA: Diagnosis not present

## 2019-06-01 DIAGNOSIS — M545 Low back pain: Secondary | ICD-10-CM | POA: Diagnosis not present

## 2019-06-01 DIAGNOSIS — M9903 Segmental and somatic dysfunction of lumbar region: Secondary | ICD-10-CM | POA: Diagnosis not present

## 2019-06-01 NOTE — Telephone Encounter (Signed)
LM asking pt to call and let us know which pharmacy (does he need local because of time) AND what meds need to be refilled.

## 2019-06-01 NOTE — Telephone Encounter (Signed)
Per Pt need refills on medicines before next appt 08-19-19 No list of meds needed provided   Please call Pt [phone]   Thanks renee

## 2019-06-07 NOTE — Telephone Encounter (Signed)
Called pt., no answer. Left message for pt to return call. Need to know which medications to fill and what pharmacy needs to be used.

## 2019-06-08 DIAGNOSIS — M9903 Segmental and somatic dysfunction of lumbar region: Secondary | ICD-10-CM | POA: Diagnosis not present

## 2019-06-08 DIAGNOSIS — M545 Low back pain: Secondary | ICD-10-CM | POA: Diagnosis not present

## 2019-06-08 DIAGNOSIS — M47816 Spondylosis without myelopathy or radiculopathy, lumbar region: Secondary | ICD-10-CM | POA: Diagnosis not present

## 2019-06-22 DIAGNOSIS — M9902 Segmental and somatic dysfunction of thoracic region: Secondary | ICD-10-CM | POA: Diagnosis not present

## 2019-06-22 DIAGNOSIS — M545 Low back pain: Secondary | ICD-10-CM | POA: Diagnosis not present

## 2019-06-22 DIAGNOSIS — M9903 Segmental and somatic dysfunction of lumbar region: Secondary | ICD-10-CM | POA: Diagnosis not present

## 2019-06-22 DIAGNOSIS — M9901 Segmental and somatic dysfunction of cervical region: Secondary | ICD-10-CM | POA: Diagnosis not present

## 2019-06-22 DIAGNOSIS — M47816 Spondylosis without myelopathy or radiculopathy, lumbar region: Secondary | ICD-10-CM | POA: Diagnosis not present

## 2019-06-25 DIAGNOSIS — E119 Type 2 diabetes mellitus without complications: Secondary | ICD-10-CM | POA: Diagnosis not present

## 2019-06-25 DIAGNOSIS — E7849 Other hyperlipidemia: Secondary | ICD-10-CM | POA: Diagnosis not present

## 2019-06-25 DIAGNOSIS — M1990 Unspecified osteoarthritis, unspecified site: Secondary | ICD-10-CM | POA: Diagnosis not present

## 2019-06-25 DIAGNOSIS — I10 Essential (primary) hypertension: Secondary | ICD-10-CM | POA: Diagnosis not present

## 2019-07-05 ENCOUNTER — Other Ambulatory Visit: Payer: Self-pay | Admitting: Cardiology

## 2019-07-06 DIAGNOSIS — M9903 Segmental and somatic dysfunction of lumbar region: Secondary | ICD-10-CM | POA: Diagnosis not present

## 2019-07-06 DIAGNOSIS — M47816 Spondylosis without myelopathy or radiculopathy, lumbar region: Secondary | ICD-10-CM | POA: Diagnosis not present

## 2019-07-06 DIAGNOSIS — M9902 Segmental and somatic dysfunction of thoracic region: Secondary | ICD-10-CM | POA: Diagnosis not present

## 2019-07-06 DIAGNOSIS — M545 Low back pain: Secondary | ICD-10-CM | POA: Diagnosis not present

## 2019-07-06 DIAGNOSIS — M9901 Segmental and somatic dysfunction of cervical region: Secondary | ICD-10-CM | POA: Diagnosis not present

## 2019-07-23 DIAGNOSIS — Z1389 Encounter for screening for other disorder: Secondary | ICD-10-CM | POA: Diagnosis not present

## 2019-07-23 DIAGNOSIS — E538 Deficiency of other specified B group vitamins: Secondary | ICD-10-CM | POA: Diagnosis not present

## 2019-07-23 DIAGNOSIS — E119 Type 2 diabetes mellitus without complications: Secondary | ICD-10-CM | POA: Diagnosis not present

## 2019-07-23 DIAGNOSIS — I10 Essential (primary) hypertension: Secondary | ICD-10-CM | POA: Diagnosis not present

## 2019-07-23 DIAGNOSIS — Z0001 Encounter for general adult medical examination with abnormal findings: Secondary | ICD-10-CM | POA: Diagnosis not present

## 2019-07-23 DIAGNOSIS — I2510 Atherosclerotic heart disease of native coronary artery without angina pectoris: Secondary | ICD-10-CM | POA: Diagnosis not present

## 2019-07-23 DIAGNOSIS — R5383 Other fatigue: Secondary | ICD-10-CM | POA: Diagnosis not present

## 2019-07-23 DIAGNOSIS — Z6826 Body mass index (BMI) 26.0-26.9, adult: Secondary | ICD-10-CM | POA: Diagnosis not present

## 2019-07-23 DIAGNOSIS — E559 Vitamin D deficiency, unspecified: Secondary | ICD-10-CM | POA: Diagnosis not present

## 2019-07-25 ENCOUNTER — Other Ambulatory Visit: Payer: Self-pay | Admitting: Cardiology

## 2019-07-26 DIAGNOSIS — M1990 Unspecified osteoarthritis, unspecified site: Secondary | ICD-10-CM | POA: Diagnosis not present

## 2019-07-26 DIAGNOSIS — I10 Essential (primary) hypertension: Secondary | ICD-10-CM | POA: Diagnosis not present

## 2019-07-26 DIAGNOSIS — E119 Type 2 diabetes mellitus without complications: Secondary | ICD-10-CM | POA: Diagnosis not present

## 2019-07-26 DIAGNOSIS — E7849 Other hyperlipidemia: Secondary | ICD-10-CM | POA: Diagnosis not present

## 2019-08-03 DIAGNOSIS — M9903 Segmental and somatic dysfunction of lumbar region: Secondary | ICD-10-CM | POA: Diagnosis not present

## 2019-08-03 DIAGNOSIS — M9901 Segmental and somatic dysfunction of cervical region: Secondary | ICD-10-CM | POA: Diagnosis not present

## 2019-08-03 DIAGNOSIS — M9902 Segmental and somatic dysfunction of thoracic region: Secondary | ICD-10-CM | POA: Diagnosis not present

## 2019-08-03 DIAGNOSIS — M47816 Spondylosis without myelopathy or radiculopathy, lumbar region: Secondary | ICD-10-CM | POA: Diagnosis not present

## 2019-08-03 DIAGNOSIS — M545 Low back pain: Secondary | ICD-10-CM | POA: Diagnosis not present

## 2019-08-12 ENCOUNTER — Ambulatory Visit (INDEPENDENT_AMBULATORY_CARE_PROVIDER_SITE_OTHER): Payer: Medicare Other | Admitting: Cardiology

## 2019-08-12 ENCOUNTER — Encounter: Payer: Self-pay | Admitting: Cardiology

## 2019-08-12 ENCOUNTER — Encounter: Payer: Self-pay | Admitting: *Deleted

## 2019-08-12 VITALS — BP 130/80 | HR 72 | Ht 67.0 in | Wt 174.4 lb

## 2019-08-12 DIAGNOSIS — I2510 Atherosclerotic heart disease of native coronary artery without angina pectoris: Secondary | ICD-10-CM | POA: Diagnosis not present

## 2019-08-12 DIAGNOSIS — E782 Mixed hyperlipidemia: Secondary | ICD-10-CM

## 2019-08-12 DIAGNOSIS — I10 Essential (primary) hypertension: Secondary | ICD-10-CM

## 2019-08-12 NOTE — Patient Instructions (Signed)
Your physician wants you to follow-up in: [place] has recommended you make the following change in your medication:   HOLD LIPITOR FOR 2 WEEKS AND CALL us WITH AN UPDATE ON YOUR SYMPTOMS   Thank you for choosing [facility]!!
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
## 2019-08-19 ENCOUNTER — Ambulatory Visit: Payer: Medicare Other | Admitting: Cardiology

## 2019-08-26 ENCOUNTER — Telehealth: Payer: Self-pay | Admitting: Cardiology

## 2019-08-26 NOTE — Telephone Encounter (Signed)
Per Branch-08/12/2019 nonspecific fatigue recently, will try holding statin x 2 weeks and update [place] Per Branch-08/12/2019 try holding starting x 2 weeks, If ongoing may try lowering his atenolol. PCP looking into other causes

## 2019-08-26 NOTE — Telephone Encounter (Signed)
Patient called in regards to being taken off of a medication . States that he was told to call Dr. Harl Bowie to let him know that he is feeling better.

## 2019-09-01 NOTE — Telephone Encounter (Signed)
Reports after holding atorvastatin 80 mg for 2 weeks, his fatigue has improved.  Patient says he is willing to try a lower dose of atorvastatin so that he can continue a statin.

## 2019-09-02 MED ORDER — ATORVASTATIN CALCIUM 20 MG PO TABS: 20.0000 mg | ORAL_TABLET | Freq: Every day | ORAL | 3 refills | Status: DC

## 2019-09-02 NOTE — Telephone Encounter (Signed)
Patient informed and verbalized understanding of plan. 

## 2019-09-02 NOTE — Telephone Encounter (Signed)
Can he take atorvastatin 20mg  daily and udpate [place] on any recurrent symptoms   Zandra Abts MD

## 2019-09-02 NOTE — Addendum Note (Signed)
Addended by: Merlene Laughter on: 09/02/2019 02:46 PM   Modules accepted: Orders

## 2019-09-24 DIAGNOSIS — E7849 Other hyperlipidemia: Secondary | ICD-10-CM | POA: Diagnosis not present

## 2019-09-24 DIAGNOSIS — E119 Type 2 diabetes mellitus without complications: Secondary | ICD-10-CM | POA: Diagnosis not present

## 2019-09-24 DIAGNOSIS — I10 Essential (primary) hypertension: Secondary | ICD-10-CM | POA: Diagnosis not present

## 2019-09-24 DIAGNOSIS — M1990 Unspecified osteoarthritis, unspecified site: Secondary | ICD-10-CM | POA: Diagnosis not present

## 2019-09-29 ENCOUNTER — Other Ambulatory Visit: Payer: Self-pay | Admitting: Cardiology

## 2019-10-01 ENCOUNTER — Telehealth: Payer: Self-pay | Admitting: Cardiology

## 2019-10-01 NOTE — Telephone Encounter (Signed)
I will ask Dr.Branch to review.

## 2019-10-01 NOTE — Telephone Encounter (Signed)
New message    Pt c/o medication issue:  1. Name of Medication: atorvastatin (LIPITOR) 20 MG tablet2. How are you currently taking this medication (dosage and times per day)? 1x  3. Are you having a reaction (difficulty breathing--STAT)? no  4. What is your medication issue? Since medication has been reduced from 80 mg to 20 mg he has been noticing a lot of chest pressure , especially when he goes outside in the heat .

## 2019-10-04 NOTE — Telephone Encounter (Signed)
Has 8 /2021 apt with A.Quinn,NP

## 2019-10-04 NOTE — Telephone Encounter (Signed)
Atorvastatin dosign would not affect chest pressure, needs PA appointment for evaluation   Zandra Abts MD

## 2019-10-08 ENCOUNTER — Other Ambulatory Visit: Payer: Self-pay | Admitting: Cardiology

## 2019-10-12 ENCOUNTER — Ambulatory Visit: Payer: Medicare Other | Admitting: Family Medicine

## 2019-10-12 DIAGNOSIS — E7800 Pure hypercholesterolemia, unspecified: Secondary | ICD-10-CM | POA: Diagnosis not present

## 2019-10-12 DIAGNOSIS — E1165 Type 2 diabetes mellitus with hyperglycemia: Secondary | ICD-10-CM | POA: Diagnosis not present

## 2019-10-12 DIAGNOSIS — Z789 Other specified health status: Secondary | ICD-10-CM | POA: Diagnosis not present

## 2019-10-12 DIAGNOSIS — I10 Essential (primary) hypertension: Secondary | ICD-10-CM | POA: Diagnosis not present

## 2019-10-14 DIAGNOSIS — Z08 Encounter for follow-up examination after completed treatment for malignant neoplasm: Secondary | ICD-10-CM | POA: Diagnosis not present

## 2019-10-14 DIAGNOSIS — L820 Inflamed seborrheic keratosis: Secondary | ICD-10-CM | POA: Diagnosis not present

## 2019-10-14 DIAGNOSIS — D225 Melanocytic nevi of trunk: Secondary | ICD-10-CM | POA: Diagnosis not present

## 2019-10-14 DIAGNOSIS — L821 Other seborrheic keratosis: Secondary | ICD-10-CM | POA: Diagnosis not present

## 2019-10-14 DIAGNOSIS — D485 Neoplasm of uncertain behavior of skin: Secondary | ICD-10-CM | POA: Diagnosis not present

## 2019-10-14 DIAGNOSIS — Z1283 Encounter for screening for malignant neoplasm of skin: Secondary | ICD-10-CM | POA: Diagnosis not present

## 2019-10-14 DIAGNOSIS — X32XXXD Exposure to sunlight, subsequent encounter: Secondary | ICD-10-CM | POA: Diagnosis not present

## 2019-10-14 DIAGNOSIS — L570 Actinic keratosis: Secondary | ICD-10-CM | POA: Diagnosis not present

## 2019-10-14 DIAGNOSIS — Z85820 Personal history of malignant melanoma of skin: Secondary | ICD-10-CM | POA: Diagnosis not present
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
## 2019-10-15 ENCOUNTER — Ambulatory Visit: Payer: Medicare Other | Admitting: Family Medicine

## 2019-10-26 DIAGNOSIS — E7849 Other hyperlipidemia: Secondary | ICD-10-CM | POA: Diagnosis not present

## 2019-10-26 DIAGNOSIS — I10 Essential (primary) hypertension: Secondary | ICD-10-CM | POA: Diagnosis not present

## 2019-10-26 DIAGNOSIS — E119 Type 2 diabetes mellitus without complications: Secondary | ICD-10-CM | POA: Diagnosis not present

## 2019-10-26 DIAGNOSIS — M1990 Unspecified osteoarthritis, unspecified site: Secondary | ICD-10-CM | POA: Diagnosis not present

## 2019-10-28 ENCOUNTER — Other Ambulatory Visit: Payer: Self-pay

## 2019-10-28 ENCOUNTER — Ambulatory Visit (INDEPENDENT_AMBULATORY_CARE_PROVIDER_SITE_OTHER): Payer: Medicare Other | Admitting: Cardiology

## 2019-10-28 ENCOUNTER — Encounter: Payer: Self-pay | Admitting: Cardiology

## 2019-10-28 VITALS — BP 142/78 | HR 70 | Ht 68.0 in | Wt 174.0 lb

## 2019-10-28 DIAGNOSIS — E782 Mixed hyperlipidemia: Secondary | ICD-10-CM | POA: Diagnosis not present

## 2019-10-28 DIAGNOSIS — I2510 Atherosclerotic heart disease of native coronary artery without angina pectoris: Secondary | ICD-10-CM | POA: Diagnosis not present

## 2019-10-28 DIAGNOSIS — I25119 Atherosclerotic heart disease of native coronary artery with unspecified angina pectoris: Secondary | ICD-10-CM | POA: Diagnosis not present

## 2019-10-28 DIAGNOSIS — I10 Essential (primary) hypertension: Secondary | ICD-10-CM

## 2019-10-28 MED ORDER — ATORVASTATIN CALCIUM 40 MG PO TABS: 40.0000 mg | ORAL_TABLET | Freq: Every day | ORAL | 3 refills | Status: DC

## 2019-10-28 NOTE — Patient Instructions (Signed)
Medication Instructions:  INCREASE Atorvastatin to 40 mg daily at dinner   *If you need a refill on your cardiac medications before your next appointment, please call your pharmacy*   Lab Work: None today If you have labs (blood work) drawn today and your tests are completely normal, you will receive your results only by:  [place] (if you have MyChart) OR  A paper copy in the mail If you have any lab test that is abnormal or we need to change your treatment, we will call you to review the results.   Testing/Procedures: Your physician has requested that you have a lexiscan myoview. For further information please visit [email or web address]. Please follow instruction sheet, as given.     Follow-Up: At [facility], you and your health needs are our priority.  As part of our continuing mission to provide you with exceptional heart care, we have created designated Provider Care Teams.  These Care Teams include your primary Cardiologist (physician) and Advanced Practice Providers (APPs -  Physician Assistants and Nurse Practitioners) who all work together to provide you with the care you need, when you need it.  We recommend signing up for the patient portal called "MyChart".  Sign up information is provided on this After Visit Summary.  MyChart is used to connect with patients for Virtual Visits (Telemedicine).  Patients are able to view lab/test results, encounter notes, upcoming appointments, etc.  Non-urgent messages can be sent to your provider as well.   To learn more about what you can do with MyChart, go to [email or web address].    Your next appointment:   3 month(s)  The format for your next appointment:   In Person  Provider:   Carlyle Dolly, MD, Bernerd Pho, PA-C or Ermalinda Barrios, PA-C   Other Instructions None      Thank you for choosing [facility] !
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
## 2019-10-29 ENCOUNTER — Encounter (HOSPITAL_COMMUNITY): Payer: Self-pay

## 2019-10-29 ENCOUNTER — Ambulatory Visit (HOSPITAL_COMMUNITY)
Admission: RE | Admit: 2019-10-29 | Discharge: 2019-10-29 | Disposition: A | Discharge status: Home / Self Care | Payer: Medicare Other | Source: Ambulatory Visit | Attending: Cardiology | Admitting: Cardiology

## 2019-10-29 ENCOUNTER — Encounter (HOSPITAL_COMMUNITY)
Admission: RE | Admit: 2019-10-29 | Discharge: 2019-10-29 | Disposition: A | Discharge status: Home / Self Care | Payer: Medicare Other | Source: Ambulatory Visit | Attending: Cardiology | Admitting: Cardiology

## 2019-10-29 DIAGNOSIS — I25119 Atherosclerotic heart disease of native coronary artery with unspecified angina pectoris: Secondary | ICD-10-CM | POA: Insufficient documentation

## 2019-10-29 LAB — NM MYOCAR MULTI W/SPECT W/WALL MOTION / EF
LV dias vol: 86 mL (ref 62–150)
LV sys vol: 29 mL
Peak HR: 90 {beats}/min
RATE: 0.52
Rest HR: 66 {beats}/min
SDS: 6
SRS: 1
SSS: 7
TID: 1.22

## 2019-10-29 MED ORDER — SODIUM CHLORIDE FLUSH 0.9 % IV SOLN
INTRAVENOUS | Status: AC
  Administered 2019-10-29: 10 mL via INTRAVENOUS
  Filled 2019-10-29: qty 10

## 2019-10-29 MED ORDER — TECHNETIUM TC 99M TETROFOSMIN IV KIT: 30.0000 | PACK | Freq: Once | INTRAVENOUS | Status: DC | PRN

## 2019-10-29 MED ORDER — REGADENOSON 0.4 MG/5ML IV SOLN
INTRAVENOUS | Status: AC
  Administered 2019-10-29: 0.4 mg via INTRAVENOUS
  Filled 2019-10-29: qty 5

## 2019-10-29 MED ORDER — TECHNETIUM TC 99M TETROFOSMIN IV KIT
30.0000 | PACK | Freq: Once | INTRAVENOUS | Status: AC | PRN
  Administered 2019-10-29: 29.8 via INTRAVENOUS

## 2019-10-29 MED ORDER — TECHNETIUM TC 99M TETROFOSMIN IV KIT
10.0000 | PACK | Freq: Once | INTRAVENOUS | Status: AC | PRN
  Administered 2019-10-29: 10.5 via INTRAVENOUS

## 2019-10-29 MED ORDER — TECHNETIUM TC 99M TETROFOSMIN IV KIT: 10.0000 | PACK | Freq: Once | INTRAVENOUS | Status: DC | PRN

## 2019-11-03 DIAGNOSIS — M9901 Segmental and somatic dysfunction of cervical region: Secondary | ICD-10-CM | POA: Diagnosis not present

## 2019-11-03 DIAGNOSIS — M9902 Segmental and somatic dysfunction of thoracic region: Secondary | ICD-10-CM | POA: Diagnosis not present

## 2019-11-03 DIAGNOSIS — M545 Low back pain: Secondary | ICD-10-CM | POA: Diagnosis not present

## 2019-11-03 DIAGNOSIS — M47816 Spondylosis without myelopathy or radiculopathy, lumbar region: Secondary | ICD-10-CM | POA: Diagnosis not present

## 2019-11-03 DIAGNOSIS — M9903 Segmental and somatic dysfunction of lumbar region: Secondary | ICD-10-CM | POA: Diagnosis not present

## 2019-11-04 ENCOUNTER — Telehealth: Payer: Self-pay | Admitting: *Deleted

## 2019-11-04 DIAGNOSIS — L988 Other specified disorders of the skin and subcutaneous tissue: Secondary | ICD-10-CM | POA: Diagnosis not present

## 2019-11-04 DIAGNOSIS — D235 Other benign neoplasm of skin of trunk: Secondary | ICD-10-CM | POA: Diagnosis not present

## 2019-11-04 DIAGNOSIS — L98499 Non-pressure chronic ulcer of skin of other sites with unspecified severity: Secondary | ICD-10-CM | POA: Diagnosis not present

## 2019-11-04 DIAGNOSIS — D3617 Benign neoplasm of peripheral nerves and autonomic nervous system of trunk, unspecified: Secondary | ICD-10-CM | POA: Diagnosis not present

## 2019-11-04 DIAGNOSIS — D485 Neoplasm of uncertain behavior of skin: Secondary | ICD-10-CM | POA: Diagnosis not present

## 2019-11-04 MED ORDER — ATENOLOL 50 MG PO TABS
75.0000 mg | ORAL_TABLET | Freq: Every day | ORAL | 3 refills | Status: DC
Start: 2019-11-04 — End: 2020-03-17

## 2019-11-04 NOTE — Telephone Encounter (Signed)
Patient informed. Copy sent to PCP °

## 2019-11-04 NOTE — Telephone Encounter (Signed)
-----   Message from Arnoldo Lenis, MD sent at 11/02/2019 12:51 PM EDT ----- Stress test overall looks good. The vast majority of the heart is getting plenty of blood, possible very small blockage on the bottom of the heart which is considered low risk and something we would just manage with meds. Can we increase his atenool to 75mg  daily, needs f/u me or PA 2 months   Zandra Abts MD

## 2019-11-11 DIAGNOSIS — L98499 Non-pressure chronic ulcer of skin of other sites with unspecified severity: Secondary | ICD-10-CM | POA: Diagnosis not present

## 2019-11-11 DIAGNOSIS — D485 Neoplasm of uncertain behavior of skin: Secondary | ICD-10-CM | POA: Diagnosis not present

## 2019-11-11 DIAGNOSIS — D3617 Benign neoplasm of peripheral nerves and autonomic nervous system of trunk, unspecified: Secondary | ICD-10-CM | POA: Diagnosis not present

## 2019-11-11 DIAGNOSIS — L988 Other specified disorders of the skin and subcutaneous tissue: Secondary | ICD-10-CM | POA: Diagnosis not present

## 2019-11-22 ENCOUNTER — Other Ambulatory Visit: Payer: Self-pay | Admitting: Cardiology

## 2019-11-25 DIAGNOSIS — I10 Essential (primary) hypertension: Secondary | ICD-10-CM | POA: Diagnosis not present

## 2019-11-25 DIAGNOSIS — E119 Type 2 diabetes mellitus without complications: Secondary | ICD-10-CM | POA: Diagnosis not present

## 2019-11-25 DIAGNOSIS — M1990 Unspecified osteoarthritis, unspecified site: Secondary | ICD-10-CM | POA: Diagnosis not present

## 2019-11-25 DIAGNOSIS — E7849 Other hyperlipidemia: Secondary | ICD-10-CM | POA: Diagnosis not present

## 2019-12-01 DIAGNOSIS — Z23 Encounter for immunization: Secondary | ICD-10-CM | POA: Diagnosis not present

## 2019-12-28 ENCOUNTER — Ambulatory Visit: Payer: Medicare Other | Admitting: Cardiology

## 2020-01-03 ENCOUNTER — Ambulatory Visit: Payer: Medicare Other | Admitting: Cardiology

## 2020-01-05 DIAGNOSIS — M9902 Segmental and somatic dysfunction of thoracic region: Secondary | ICD-10-CM | POA: Diagnosis not present

## 2020-01-05 DIAGNOSIS — M47816 Spondylosis without myelopathy or radiculopathy, lumbar region: Secondary | ICD-10-CM | POA: Diagnosis not present

## 2020-01-05 DIAGNOSIS — M545 Low back pain, unspecified: Secondary | ICD-10-CM | POA: Diagnosis not present

## 2020-01-05 DIAGNOSIS — M9903 Segmental and somatic dysfunction of lumbar region: Secondary | ICD-10-CM | POA: Diagnosis not present

## 2020-01-05 DIAGNOSIS — M9901 Segmental and somatic dysfunction of cervical region: Secondary | ICD-10-CM | POA: Diagnosis not present

## 2020-01-18 ENCOUNTER — Other Ambulatory Visit: Payer: Self-pay | Admitting: Cardiology

## 2020-01-18 MED ORDER — TRANDOLAPRIL 4 MG PO TABS
4.0000 mg | ORAL_TABLET | Freq: Every day | ORAL | 3 refills | Status: DC
Start: 2020-01-18 — End: 2021-01-22

## 2020-01-18 NOTE — Telephone Encounter (Signed)
NEW MESSAGE     *STAT* If patient is at the pharmacy, call can be transferred to refill team.   1. Which medications need to be refilled? (please list name of each medication and dose if known) TRANDOLAPRIL 4MG   2. Which pharmacy/location (including street and city if local pharmacy) is medication to be sent to? [facility] [address] [place]  3. Do they need a 30 day or 90 day supply? [place]

## 2020-01-18 NOTE — Telephone Encounter (Signed)
Refilled  to [facility] [address]

## 2020-01-27 ENCOUNTER — Ambulatory Visit: Payer: Medicare Other | Admitting: Student

## 2020-02-01 ENCOUNTER — Ambulatory Visit: Payer: Medicare Other | Admitting: Cardiology

## 2020-02-01 DIAGNOSIS — E7800 Pure hypercholesterolemia, unspecified: Secondary | ICD-10-CM | POA: Diagnosis not present

## 2020-02-01 DIAGNOSIS — Z789 Other specified health status: Secondary | ICD-10-CM | POA: Diagnosis not present

## 2020-02-01 DIAGNOSIS — I10 Essential (primary) hypertension: Secondary | ICD-10-CM | POA: Diagnosis not present

## 2020-02-01 DIAGNOSIS — E1165 Type 2 diabetes mellitus with hyperglycemia: Secondary | ICD-10-CM | POA: Diagnosis not present

## 2020-02-03 DIAGNOSIS — L821 Other seborrheic keratosis: Secondary | ICD-10-CM | POA: Diagnosis not present

## 2020-02-03 DIAGNOSIS — Z1283 Encounter for screening for malignant neoplasm of skin: Secondary | ICD-10-CM | POA: Diagnosis not present

## 2020-02-03 DIAGNOSIS — Z08 Encounter for follow-up examination after completed treatment for malignant neoplasm: Secondary | ICD-10-CM | POA: Diagnosis not present

## 2020-02-03 DIAGNOSIS — L570 Actinic keratosis: Secondary | ICD-10-CM | POA: Diagnosis not present

## 2020-02-03 DIAGNOSIS — Z86006 Personal history of melanoma in-situ: Secondary | ICD-10-CM | POA: Diagnosis not present

## 2020-02-03 DIAGNOSIS — X32XXXD Exposure to sunlight, subsequent encounter: Secondary | ICD-10-CM | POA: Diagnosis not present

## 2020-02-04 ENCOUNTER — Ambulatory Visit: Payer: Medicare Other | Admitting: Cardiology

## 2020-02-15 DIAGNOSIS — Z23 Encounter for immunization: Secondary | ICD-10-CM | POA: Diagnosis not present

## 2020-02-25 DIAGNOSIS — M1990 Unspecified osteoarthritis, unspecified site: Secondary | ICD-10-CM | POA: Diagnosis not present

## 2020-02-25 DIAGNOSIS — I10 Essential (primary) hypertension: Secondary | ICD-10-CM | POA: Diagnosis not present

## 2020-02-25 DIAGNOSIS — E7849 Other hyperlipidemia: Secondary | ICD-10-CM | POA: Diagnosis not present

## 2020-02-25 DIAGNOSIS — E119 Type 2 diabetes mellitus without complications: Secondary | ICD-10-CM | POA: Diagnosis not present

## 2020-03-01 DIAGNOSIS — M9903 Segmental and somatic dysfunction of lumbar region: Secondary | ICD-10-CM | POA: Diagnosis not present

## 2020-03-01 DIAGNOSIS — M9901 Segmental and somatic dysfunction of cervical region: Secondary | ICD-10-CM | POA: Diagnosis not present

## 2020-03-01 DIAGNOSIS — M47816 Spondylosis without myelopathy or radiculopathy, lumbar region: Secondary | ICD-10-CM | POA: Diagnosis not present

## 2020-03-01 DIAGNOSIS — M545 Low back pain, unspecified: Secondary | ICD-10-CM | POA: Diagnosis not present

## 2020-03-01 DIAGNOSIS — M9902 Segmental and somatic dysfunction of thoracic region: Secondary | ICD-10-CM | POA: Diagnosis not present

## 2020-03-15 DIAGNOSIS — I10 Essential (primary) hypertension: Secondary | ICD-10-CM | POA: Diagnosis not present

## 2020-03-15 DIAGNOSIS — E1165 Type 2 diabetes mellitus with hyperglycemia: Secondary | ICD-10-CM | POA: Diagnosis not present

## 2020-03-15 DIAGNOSIS — E7800 Pure hypercholesterolemia, unspecified: Secondary | ICD-10-CM | POA: Diagnosis not present

## 2020-03-15 DIAGNOSIS — Z789 Other specified health status: Secondary | ICD-10-CM | POA: Diagnosis not present

## 2020-03-17 ENCOUNTER — Encounter: Payer: Self-pay | Admitting: Cardiology

## 2020-03-17 ENCOUNTER — Ambulatory Visit (INDEPENDENT_AMBULATORY_CARE_PROVIDER_SITE_OTHER): Payer: Medicare Other | Admitting: Cardiology

## 2020-03-17 ENCOUNTER — Other Ambulatory Visit: Payer: Self-pay

## 2020-03-17 VITALS — BP 158/80 | HR 64 | Ht 67.0 in | Wt 180.0 lb

## 2020-03-17 DIAGNOSIS — E782 Mixed hyperlipidemia: Secondary | ICD-10-CM

## 2020-03-17 DIAGNOSIS — I10 Essential (primary) hypertension: Secondary | ICD-10-CM | POA: Diagnosis not present

## 2020-03-17 DIAGNOSIS — I25118 Atherosclerotic heart disease of native coronary artery with other forms of angina pectoris: Secondary | ICD-10-CM

## 2020-03-17 MED ORDER — ATENOLOL 100 MG PO TABS: 100.0000 mg | ORAL_TABLET | Freq: Every day | ORAL | 1 refills | Status: DC

## 2020-03-17 NOTE — Patient Instructions (Signed)
Your physician recommends that you schedule a follow-up appointment in: [place] has recommended you make the following change in your medication:   INCREASE ATENOLOL 100 MG DAILY   Your physician recommends that you return for lab work in: [place] FAST 6-8 HOURS PRIOR TO LAB WORK   Thank you for choosing [facility]!!
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
## 2020-03-20 ENCOUNTER — Other Ambulatory Visit: Payer: Self-pay | Admitting: Cardiology

## 2020-03-20 DIAGNOSIS — E782 Mixed hyperlipidemia: Secondary | ICD-10-CM | POA: Diagnosis not present

## 2020-03-20 LAB — LIPID PANEL
Cholesterol: 144 mg/dL (ref <200)
HDL: 36 mg/dL — ABNORMAL LOW (ref >=40)
LDL Cholesterol (Calc): 80 mg/dL (calc)
Non-HDL Cholesterol (Calc): 108 mg/dL (calc) (ref <130)
Total CHOL/HDL Ratio: 4 (calc) (ref <5.0)
Triglycerides: 190 mg/dL — ABNORMAL HIGH (ref <150)

## 2020-03-24 ENCOUNTER — Telehealth: Payer: Self-pay | Admitting: *Deleted

## 2020-03-24 MED ORDER — ROSUVASTATIN CALCIUM 20 MG PO TABS: 20.0000 mg | ORAL_TABLET | Freq: Every day | ORAL | 3 refills | Status: DC

## 2020-03-24 NOTE — Telephone Encounter (Signed)
-----   Message from Arnoldo Lenis, MD sent at 03/24/2020 11:08 AM EST ----- Choleseterol is mildly above goal. Goal LDL would be <70, his is 80. Could consider adding a new medication to what he is currently taking or possibly trying a strong statin called crestor. He had some side effects on higher doses of lipitor, but may tolerate crestor better. If willing to try strong statin would stop lipitor and start crestor 20mg  daily   Zandra Abts MD

## 2020-03-24 NOTE — Telephone Encounter (Signed)
Patient informed, agrees to change to crestor and verbalized understanding of plan. Copy sent to PCP

## 2020-03-30 DIAGNOSIS — L905 Scar conditions and fibrosis of skin: Secondary | ICD-10-CM | POA: Diagnosis not present

## 2020-04-26 DIAGNOSIS — M9903 Segmental and somatic dysfunction of lumbar region: Secondary | ICD-10-CM | POA: Diagnosis not present

## 2020-04-26 DIAGNOSIS — M9902 Segmental and somatic dysfunction of thoracic region: Secondary | ICD-10-CM | POA: Diagnosis not present

## 2020-04-26 DIAGNOSIS — M47816 Spondylosis without myelopathy or radiculopathy, lumbar region: Secondary | ICD-10-CM | POA: Diagnosis not present

## 2020-04-26 DIAGNOSIS — M9901 Segmental and somatic dysfunction of cervical region: Secondary | ICD-10-CM | POA: Diagnosis not present

## 2020-04-26 DIAGNOSIS — M545 Low back pain, unspecified: Secondary | ICD-10-CM | POA: Diagnosis not present

## 2020-05-08 DIAGNOSIS — Z08 Encounter for follow-up examination after completed treatment for malignant neoplasm: Secondary | ICD-10-CM | POA: Diagnosis not present

## 2020-05-08 DIAGNOSIS — C44319 Basal cell carcinoma of skin of other parts of face: Secondary | ICD-10-CM | POA: Diagnosis not present

## 2020-05-08 DIAGNOSIS — L570 Actinic keratosis: Secondary | ICD-10-CM | POA: Diagnosis not present

## 2020-05-08 DIAGNOSIS — D225 Melanocytic nevi of trunk: Secondary | ICD-10-CM | POA: Diagnosis not present

## 2020-05-08 DIAGNOSIS — X32XXXD Exposure to sunlight, subsequent encounter: Secondary | ICD-10-CM | POA: Diagnosis not present

## 2020-05-08 DIAGNOSIS — Z85820 Personal history of malignant melanoma of skin: Secondary | ICD-10-CM | POA: Diagnosis not present

## 2020-05-08 DIAGNOSIS — L258 Unspecified contact dermatitis due to other agents: Secondary | ICD-10-CM | POA: Diagnosis not present

## 2020-05-08 DIAGNOSIS — Z1283 Encounter for screening for malignant neoplasm of skin: Secondary | ICD-10-CM | POA: Diagnosis not present

## 2020-05-24 DIAGNOSIS — E7849 Other hyperlipidemia: Secondary | ICD-10-CM | POA: Diagnosis not present

## 2020-05-24 DIAGNOSIS — I10 Essential (primary) hypertension: Secondary | ICD-10-CM | POA: Diagnosis not present

## 2020-05-24 DIAGNOSIS — E119 Type 2 diabetes mellitus without complications: Secondary | ICD-10-CM | POA: Diagnosis not present

## 2020-06-14 ENCOUNTER — Telehealth: Payer: Self-pay | Admitting: Cardiology

## 2020-06-14 DIAGNOSIS — M545 Low back pain, unspecified: Secondary | ICD-10-CM | POA: Diagnosis not present

## 2020-06-14 DIAGNOSIS — M47816 Spondylosis without myelopathy or radiculopathy, lumbar region: Secondary | ICD-10-CM | POA: Diagnosis not present

## 2020-06-14 DIAGNOSIS — M9902 Segmental and somatic dysfunction of thoracic region: Secondary | ICD-10-CM | POA: Diagnosis not present

## 2020-06-14 DIAGNOSIS — M9903 Segmental and somatic dysfunction of lumbar region: Secondary | ICD-10-CM | POA: Diagnosis not present

## 2020-06-14 DIAGNOSIS — M9901 Segmental and somatic dysfunction of cervical region: Secondary | ICD-10-CM | POA: Diagnosis not present

## 2020-06-14 MED ORDER — AMLODIPINE BESYLATE 5 MG PO TABS: 5.0000 mg | ORAL_TABLET | Freq: Every day | ORAL | 3 refills | Status: DC

## 2020-06-14 NOTE — Telephone Encounter (Signed)
Correction: Patient is taking Atenolol 100 mg QD  Patient is taking daily regimen as follows.  Norvasc 5 mg daily added to patients medication list.

## 2020-06-14 NOTE — Telephone Encounter (Signed)
Lisa with [facility] called to let us know that the patient is still having an issue with high blood pressures. They are as follows: (Dates were not provided)  151/87 160/90 148/84 151/87 154/89  The patient was seen by the pharmacist for a medication review and did not complain to the pharmacist about any other associated symptoms. Patient is taking Labetalol 100 mg QD and has gotten into the habit of checking his blood pressure daily and keeping a log.  Please advise.

## 2020-06-14 NOTE — Telephone Encounter (Signed)
Debbora Lacrosse with [facility] called to discuss medication management and BP (high) [phone].

## 2020-06-14 NOTE — Telephone Encounter (Signed)
Our med list has him on atenolol as opposed to labetolol, if we could clarify that. Should also be on trandalopril 4mg  daily for bp. If taking this regimen and bp's are high can start norvasc 5mg  daily.   Carlyle Dolly MD

## 2020-06-19 DIAGNOSIS — D2271 Melanocytic nevi of right lower limb, including hip: Secondary | ICD-10-CM | POA: Diagnosis not present

## 2020-06-19 DIAGNOSIS — C44319 Basal cell carcinoma of skin of other parts of face: Secondary | ICD-10-CM | POA: Diagnosis not present

## 2020-06-19 DIAGNOSIS — X32XXXD Exposure to sunlight, subsequent encounter: Secondary | ICD-10-CM | POA: Diagnosis not present

## 2020-06-19 DIAGNOSIS — L570 Actinic keratosis: Secondary | ICD-10-CM | POA: Diagnosis not present

## 2020-06-19 DIAGNOSIS — D2272 Melanocytic nevi of left lower limb, including hip: Secondary | ICD-10-CM | POA: Diagnosis not present

## 2020-06-24 DIAGNOSIS — E119 Type 2 diabetes mellitus without complications: Secondary | ICD-10-CM | POA: Diagnosis not present

## 2020-06-24 DIAGNOSIS — I10 Essential (primary) hypertension: Secondary | ICD-10-CM | POA: Diagnosis not present

## 2020-06-24 DIAGNOSIS — E7849 Other hyperlipidemia: Secondary | ICD-10-CM | POA: Diagnosis not present

## 2020-07-07 DIAGNOSIS — B349 Viral infection, unspecified: Secondary | ICD-10-CM | POA: Diagnosis not present

## 2020-07-07 DIAGNOSIS — E663 Overweight: Secondary | ICD-10-CM | POA: Diagnosis not present

## 2020-07-07 DIAGNOSIS — Z1331 Encounter for screening for depression: Secondary | ICD-10-CM | POA: Diagnosis not present

## 2020-07-07 DIAGNOSIS — Z6828 Body mass index (BMI) 28.0-28.9, adult: Secondary | ICD-10-CM | POA: Diagnosis not present

## 2020-07-13 DIAGNOSIS — E7800 Pure hypercholesterolemia, unspecified: Secondary | ICD-10-CM | POA: Diagnosis not present

## 2020-07-13 DIAGNOSIS — I10 Essential (primary) hypertension: Secondary | ICD-10-CM | POA: Diagnosis not present

## 2020-07-13 DIAGNOSIS — E1165 Type 2 diabetes mellitus with hyperglycemia: Secondary | ICD-10-CM | POA: Diagnosis not present

## 2020-07-13 DIAGNOSIS — Z789 Other specified health status: Secondary | ICD-10-CM | POA: Diagnosis not present

## 2020-07-17 DIAGNOSIS — M47816 Spondylosis without myelopathy or radiculopathy, lumbar region: Secondary | ICD-10-CM | POA: Diagnosis not present

## 2020-07-17 DIAGNOSIS — M545 Low back pain, unspecified: Secondary | ICD-10-CM | POA: Diagnosis not present

## 2020-07-17 DIAGNOSIS — M9902 Segmental and somatic dysfunction of thoracic region: Secondary | ICD-10-CM | POA: Diagnosis not present

## 2020-07-17 DIAGNOSIS — D485 Neoplasm of uncertain behavior of skin: Secondary | ICD-10-CM | POA: Diagnosis not present

## 2020-07-17 DIAGNOSIS — M9903 Segmental and somatic dysfunction of lumbar region: Secondary | ICD-10-CM | POA: Diagnosis not present

## 2020-07-17 DIAGNOSIS — L905 Scar conditions and fibrosis of skin: Secondary | ICD-10-CM | POA: Diagnosis not present

## 2020-07-17 DIAGNOSIS — Z1283 Encounter for screening for malignant neoplasm of skin: Secondary | ICD-10-CM | POA: Diagnosis not present

## 2020-07-17 DIAGNOSIS — M9901 Segmental and somatic dysfunction of cervical region: Secondary | ICD-10-CM | POA: Diagnosis not present

## 2020-07-18 DIAGNOSIS — M545 Low back pain, unspecified: Secondary | ICD-10-CM | POA: Diagnosis not present

## 2020-07-18 DIAGNOSIS — M9901 Segmental and somatic dysfunction of cervical region: Secondary | ICD-10-CM | POA: Diagnosis not present

## 2020-07-18 DIAGNOSIS — M9902 Segmental and somatic dysfunction of thoracic region: Secondary | ICD-10-CM | POA: Diagnosis not present

## 2020-07-18 DIAGNOSIS — M47816 Spondylosis without myelopathy or radiculopathy, lumbar region: Secondary | ICD-10-CM | POA: Diagnosis not present

## 2020-07-18 DIAGNOSIS — M9903 Segmental and somatic dysfunction of lumbar region: Secondary | ICD-10-CM | POA: Diagnosis not present

## 2020-07-19 DIAGNOSIS — M9903 Segmental and somatic dysfunction of lumbar region: Secondary | ICD-10-CM | POA: Diagnosis not present

## 2020-07-19 DIAGNOSIS — M9901 Segmental and somatic dysfunction of cervical region: Secondary | ICD-10-CM | POA: Diagnosis not present

## 2020-07-19 DIAGNOSIS — M545 Low back pain, unspecified: Secondary | ICD-10-CM | POA: Diagnosis not present

## 2020-07-19 DIAGNOSIS — M47816 Spondylosis without myelopathy or radiculopathy, lumbar region: Secondary | ICD-10-CM | POA: Diagnosis not present

## 2020-07-19 DIAGNOSIS — M9902 Segmental and somatic dysfunction of thoracic region: Secondary | ICD-10-CM | POA: Diagnosis not present

## 2020-07-21 ENCOUNTER — Ambulatory Visit: Payer: Medicare Other | Admitting: Cardiology

## 2020-07-21 DIAGNOSIS — M9903 Segmental and somatic dysfunction of lumbar region: Secondary | ICD-10-CM | POA: Diagnosis not present

## 2020-07-21 DIAGNOSIS — M47816 Spondylosis without myelopathy or radiculopathy, lumbar region: Secondary | ICD-10-CM | POA: Diagnosis not present

## 2020-07-21 DIAGNOSIS — M545 Low back pain, unspecified: Secondary | ICD-10-CM | POA: Diagnosis not present

## 2020-07-21 DIAGNOSIS — M9902 Segmental and somatic dysfunction of thoracic region: Secondary | ICD-10-CM | POA: Diagnosis not present

## 2020-07-21 DIAGNOSIS — M9901 Segmental and somatic dysfunction of cervical region: Secondary | ICD-10-CM | POA: Diagnosis not present

## 2020-07-25 DIAGNOSIS — E119 Type 2 diabetes mellitus without complications: Secondary | ICD-10-CM | POA: Diagnosis not present

## 2020-07-25 DIAGNOSIS — I10 Essential (primary) hypertension: Secondary | ICD-10-CM | POA: Diagnosis not present

## 2020-07-25 DIAGNOSIS — E7849 Other hyperlipidemia: Secondary | ICD-10-CM | POA: Diagnosis not present

## 2020-08-01 DIAGNOSIS — Z1331 Encounter for screening for depression: Secondary | ICD-10-CM | POA: Diagnosis not present

## 2020-08-01 DIAGNOSIS — Z6827 Body mass index (BMI) 27.0-27.9, adult: Secondary | ICD-10-CM | POA: Diagnosis not present

## 2020-08-01 DIAGNOSIS — E7849 Other hyperlipidemia: Secondary | ICD-10-CM | POA: Diagnosis not present

## 2020-08-01 DIAGNOSIS — I2510 Atherosclerotic heart disease of native coronary artery without angina pectoris: Secondary | ICD-10-CM | POA: Diagnosis not present

## 2020-08-01 DIAGNOSIS — I10 Essential (primary) hypertension: Secondary | ICD-10-CM | POA: Diagnosis not present

## 2020-08-01 DIAGNOSIS — Z0001 Encounter for general adult medical examination with abnormal findings: Secondary | ICD-10-CM | POA: Diagnosis not present

## 2020-08-01 DIAGNOSIS — E663 Overweight: Secondary | ICD-10-CM | POA: Diagnosis not present

## 2020-08-01 DIAGNOSIS — Z1389 Encounter for screening for other disorder: Secondary | ICD-10-CM | POA: Diagnosis not present

## 2020-08-01 DIAGNOSIS — E119 Type 2 diabetes mellitus without complications: Secondary | ICD-10-CM | POA: Diagnosis not present

## 2020-08-02 DIAGNOSIS — M9902 Segmental and somatic dysfunction of thoracic region: Secondary | ICD-10-CM | POA: Diagnosis not present

## 2020-08-02 DIAGNOSIS — M545 Low back pain, unspecified: Secondary | ICD-10-CM | POA: Diagnosis not present

## 2020-08-02 DIAGNOSIS — M9903 Segmental and somatic dysfunction of lumbar region: Secondary | ICD-10-CM | POA: Diagnosis not present

## 2020-08-02 DIAGNOSIS — M9901 Segmental and somatic dysfunction of cervical region: Secondary | ICD-10-CM | POA: Diagnosis not present

## 2020-08-02 DIAGNOSIS — M47816 Spondylosis without myelopathy or radiculopathy, lumbar region: Secondary | ICD-10-CM | POA: Diagnosis not present

## 2020-08-09 DIAGNOSIS — M9902 Segmental and somatic dysfunction of thoracic region: Secondary | ICD-10-CM | POA: Diagnosis not present

## 2020-08-09 DIAGNOSIS — M9903 Segmental and somatic dysfunction of lumbar region: Secondary | ICD-10-CM | POA: Diagnosis not present

## 2020-08-09 DIAGNOSIS — M545 Low back pain, unspecified: Secondary | ICD-10-CM | POA: Diagnosis not present

## 2020-08-09 DIAGNOSIS — M47816 Spondylosis without myelopathy or radiculopathy, lumbar region: Secondary | ICD-10-CM | POA: Diagnosis not present

## 2020-08-09 DIAGNOSIS — M9901 Segmental and somatic dysfunction of cervical region: Secondary | ICD-10-CM | POA: Diagnosis not present

## 2020-08-11 DIAGNOSIS — E1165 Type 2 diabetes mellitus with hyperglycemia: Secondary | ICD-10-CM | POA: Diagnosis not present

## 2020-08-23 ENCOUNTER — Encounter: Payer: Self-pay | Admitting: *Deleted

## 2020-08-23 ENCOUNTER — Ambulatory Visit (INDEPENDENT_AMBULATORY_CARE_PROVIDER_SITE_OTHER): Payer: Medicare Other | Admitting: Cardiology

## 2020-08-23 ENCOUNTER — Encounter: Payer: Self-pay | Admitting: Cardiology

## 2020-08-23 VITALS — BP 138/84 | HR 66 | Ht 67.0 in | Wt 175.6 lb

## 2020-08-23 DIAGNOSIS — I25118 Atherosclerotic heart disease of native coronary artery with other forms of angina pectoris: Secondary | ICD-10-CM | POA: Diagnosis not present

## 2020-08-23 DIAGNOSIS — M47816 Spondylosis without myelopathy or radiculopathy, lumbar region: Secondary | ICD-10-CM | POA: Diagnosis not present

## 2020-08-23 DIAGNOSIS — M9902 Segmental and somatic dysfunction of thoracic region: Secondary | ICD-10-CM | POA: Diagnosis not present

## 2020-08-23 DIAGNOSIS — I10 Essential (primary) hypertension: Secondary | ICD-10-CM | POA: Diagnosis not present

## 2020-08-23 DIAGNOSIS — E782 Mixed hyperlipidemia: Secondary | ICD-10-CM

## 2020-08-23 DIAGNOSIS — M9901 Segmental and somatic dysfunction of cervical region: Secondary | ICD-10-CM | POA: Diagnosis not present

## 2020-08-23 DIAGNOSIS — M9903 Segmental and somatic dysfunction of lumbar region: Secondary | ICD-10-CM | POA: Diagnosis not present

## 2020-08-23 DIAGNOSIS — M545 Low back pain, unspecified: Secondary | ICD-10-CM | POA: Diagnosis not present
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
## 2020-08-23 NOTE — Patient Instructions (Addendum)

## 2020-08-24 DIAGNOSIS — E782 Mixed hyperlipidemia: Secondary | ICD-10-CM | POA: Diagnosis not present

## 2020-08-24 DIAGNOSIS — I10 Essential (primary) hypertension: Secondary | ICD-10-CM | POA: Diagnosis not present

## 2020-08-24 DIAGNOSIS — E119 Type 2 diabetes mellitus without complications: Secondary | ICD-10-CM | POA: Diagnosis not present

## 2020-08-25 DIAGNOSIS — G4730 Sleep apnea, unspecified: Secondary | ICD-10-CM | POA: Diagnosis not present

## 2020-08-31 DIAGNOSIS — H25013 Cortical age-related cataract, bilateral: Secondary | ICD-10-CM | POA: Diagnosis not present

## 2020-08-31 DIAGNOSIS — E119 Type 2 diabetes mellitus without complications: Secondary | ICD-10-CM | POA: Diagnosis not present

## 2020-08-31 DIAGNOSIS — H52203 Unspecified astigmatism, bilateral: Secondary | ICD-10-CM | POA: Diagnosis not present

## 2020-08-31 DIAGNOSIS — H2513 Age-related nuclear cataract, bilateral: Secondary | ICD-10-CM | POA: Diagnosis not present

## 2020-09-08 ENCOUNTER — Other Ambulatory Visit: Payer: Self-pay | Admitting: Cardiology

## 2020-09-14 ENCOUNTER — Ambulatory Visit: Payer: Medicare Other | Admitting: Cardiology

## 2020-09-20 DIAGNOSIS — M9903 Segmental and somatic dysfunction of lumbar region: Secondary | ICD-10-CM | POA: Diagnosis not present

## 2020-09-20 DIAGNOSIS — M47816 Spondylosis without myelopathy or radiculopathy, lumbar region: Secondary | ICD-10-CM | POA: Diagnosis not present

## 2020-09-20 DIAGNOSIS — M545 Low back pain, unspecified: Secondary | ICD-10-CM | POA: Diagnosis not present

## 2020-09-20 DIAGNOSIS — M9901 Segmental and somatic dysfunction of cervical region: Secondary | ICD-10-CM | POA: Diagnosis not present

## 2020-09-20 DIAGNOSIS — M9902 Segmental and somatic dysfunction of thoracic region: Secondary | ICD-10-CM | POA: Diagnosis not present

## 2020-10-18 DIAGNOSIS — M47816 Spondylosis without myelopathy or radiculopathy, lumbar region: Secondary | ICD-10-CM | POA: Diagnosis not present

## 2020-10-18 DIAGNOSIS — M545 Low back pain, unspecified: Secondary | ICD-10-CM | POA: Diagnosis not present

## 2020-10-18 DIAGNOSIS — M9902 Segmental and somatic dysfunction of thoracic region: Secondary | ICD-10-CM | POA: Diagnosis not present

## 2020-10-18 DIAGNOSIS — M9901 Segmental and somatic dysfunction of cervical region: Secondary | ICD-10-CM | POA: Diagnosis not present

## 2020-10-18 DIAGNOSIS — M9903 Segmental and somatic dysfunction of lumbar region: Secondary | ICD-10-CM | POA: Diagnosis not present

## 2020-11-19 ENCOUNTER — Other Ambulatory Visit: Payer: Self-pay

## 2020-11-19 ENCOUNTER — Ambulatory Visit (INDEPENDENT_AMBULATORY_CARE_PROVIDER_SITE_OTHER): Payer: Medicare Other

## 2020-11-19 ENCOUNTER — Ambulatory Visit
Admission: EM | Admit: 2020-11-19 | Discharge: 2020-11-19 | Disposition: A | Discharge status: Home / Self Care | Payer: Medicare Other | Attending: Emergency Medicine | Admitting: Emergency Medicine

## 2020-11-19 DIAGNOSIS — R0781 Pleurodynia: Secondary | ICD-10-CM | POA: Diagnosis not present

## 2020-11-19 DIAGNOSIS — R0782 Intercostal pain: Secondary | ICD-10-CM | POA: Diagnosis not present

## 2020-11-19 MED ORDER — BENZONATATE 100 MG PO CAPS: 100.0000 mg | ORAL_CAPSULE | Freq: Three times a day (TID) | ORAL | 0 refills | Status: DC

## 2020-11-19 NOTE — Discharge Instructions (Signed)
Continue to take OTC Tylenol as needed for pain Tessalon Perles prescribed to help suppress cough and decreased pain May use ice to massage area Follow-up with PCP Return or go to ED if you develop any new or worsening of his symptom
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
## 2020-11-19 NOTE — ED Triage Notes (Signed)
Patient presents to Urgent Care with complaints of a trip and fall on Friday afternoon. He is concerned with possible rib fracture to right side. He states pain has increased. Pt treating pain with aleve.   Denies head trauma, LOC, or N/V.

## 2020-11-22 DIAGNOSIS — M47816 Spondylosis without myelopathy or radiculopathy, lumbar region: Secondary | ICD-10-CM | POA: Diagnosis not present

## 2020-11-22 DIAGNOSIS — M545 Low back pain, unspecified: Secondary | ICD-10-CM | POA: Diagnosis not present

## 2020-11-22 DIAGNOSIS — M9901 Segmental and somatic dysfunction of cervical region: Secondary | ICD-10-CM | POA: Diagnosis not present

## 2020-11-22 DIAGNOSIS — M9902 Segmental and somatic dysfunction of thoracic region: Secondary | ICD-10-CM | POA: Diagnosis not present

## 2020-11-22 DIAGNOSIS — M9903 Segmental and somatic dysfunction of lumbar region: Secondary | ICD-10-CM | POA: Diagnosis not present

## 2020-12-05 IMAGING — CT CT NECK W/ CM
3 of 4 series · 13 of 33 positions shown, 16 images · IV contrast (omnipaque)
Comparison: None.

CLINICAL DATA: Patient swallowed tooth pick 3 days ago. Throat
pain. Negative endoscopy exam.

EXAM:
CT NECK WITH CONTRAST
TECHNIQUE: Multidetector CT imaging of the neck was performed using the
standard protocol following the bolus administration of intravenous
contrast.
CONTRAST:  75mL OMNIPAQUE IOHEXOL 300 MG/ML  SOLN

[Series 6: coronal neck · coronal · 0.43mm/px · 3 of 108 slices shown]
[im 22/108  bone]
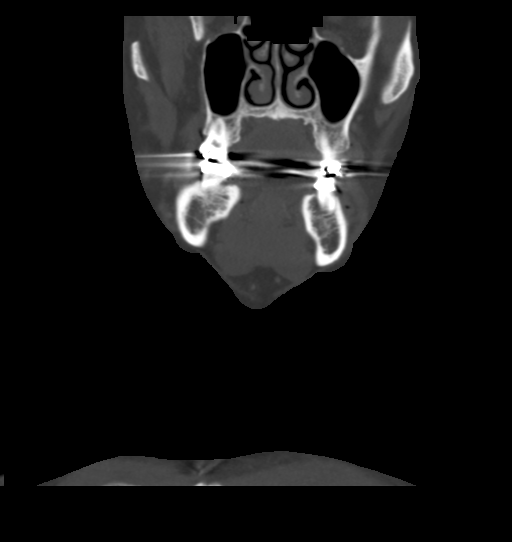
[im 43/108  bone]
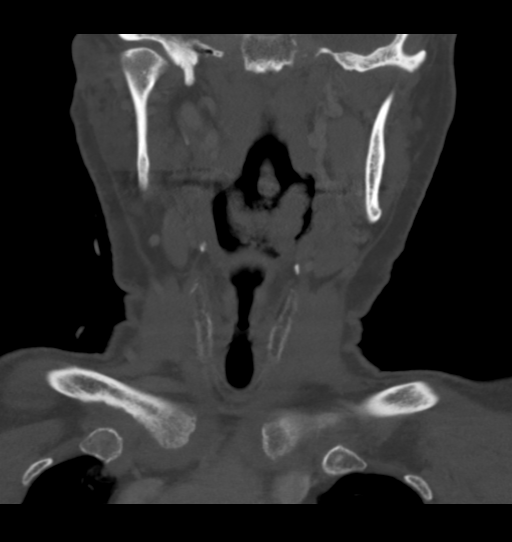
[im 65/108  bone]
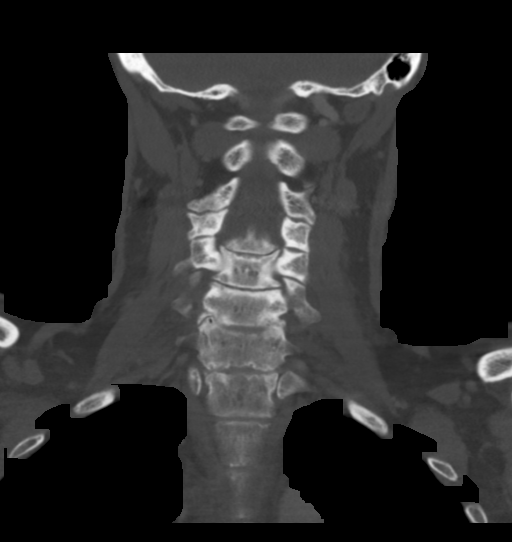

[Series 7: sagittal neck · sagittal · 0.41mm/px · 5 of 101 slices shown, 6 images]
[im 34/101  bone]
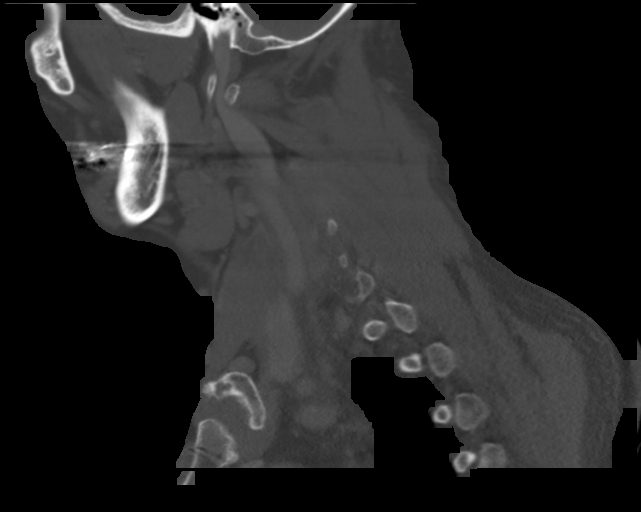
[im 42/101  bone]
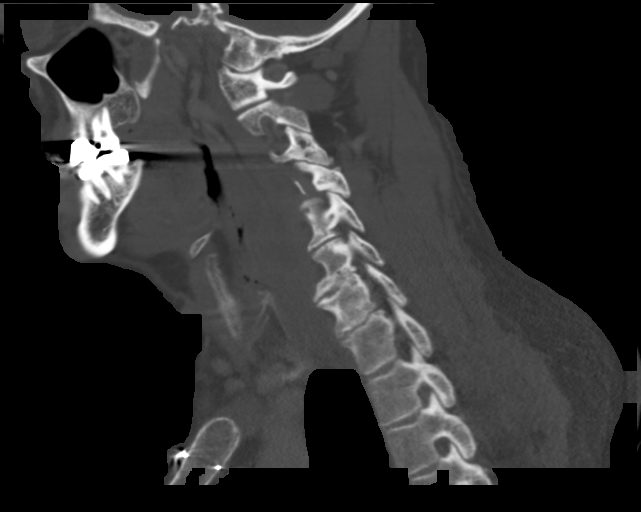
[im 51/101  soft-tissue]
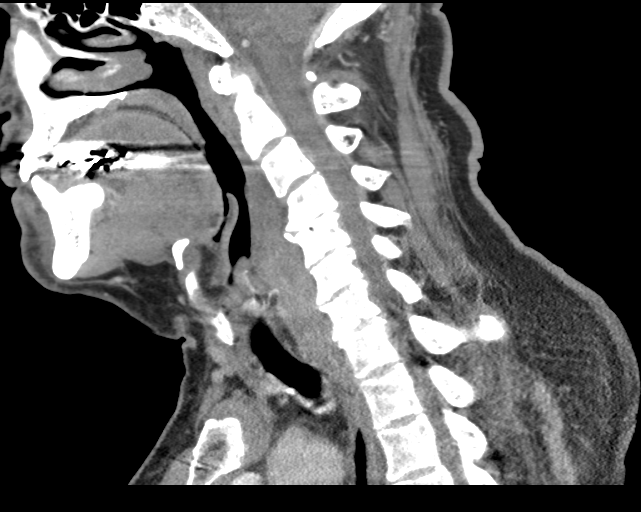
[im 51/101  bone]
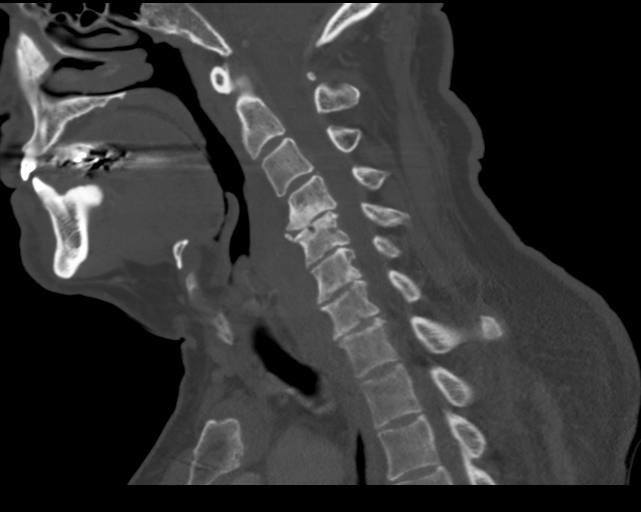
[im 59/101  bone]
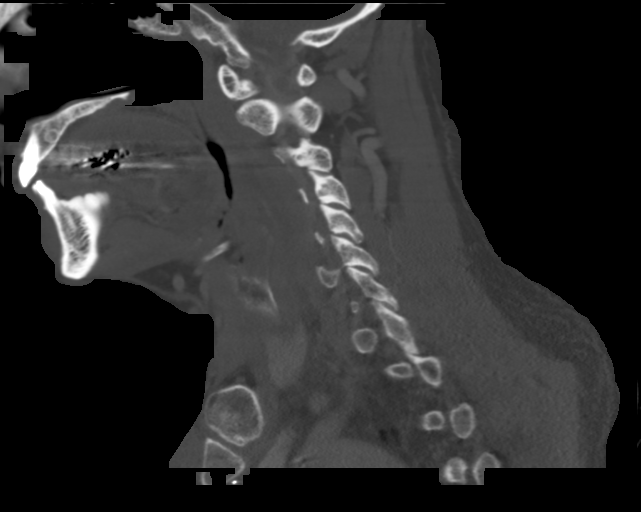
[im 67/101  bone]
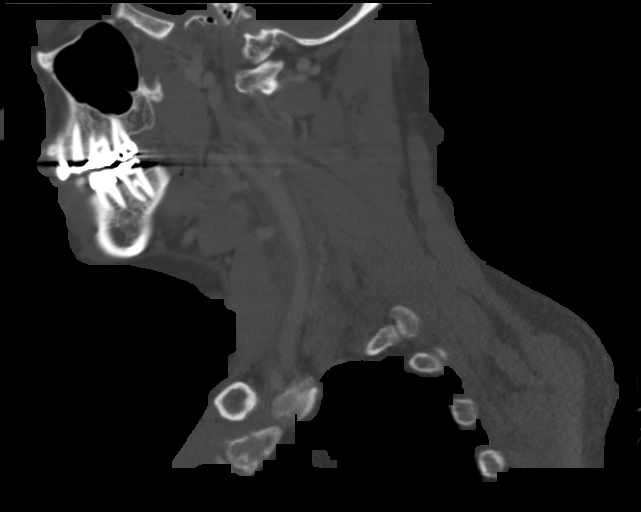

[Series 8: orthogonal ax · axial · 0.39mm/px · z∈[-130,+37]mm · 5 of 118 slices shown, 7 images]
[im 17/118  soft-tissue]
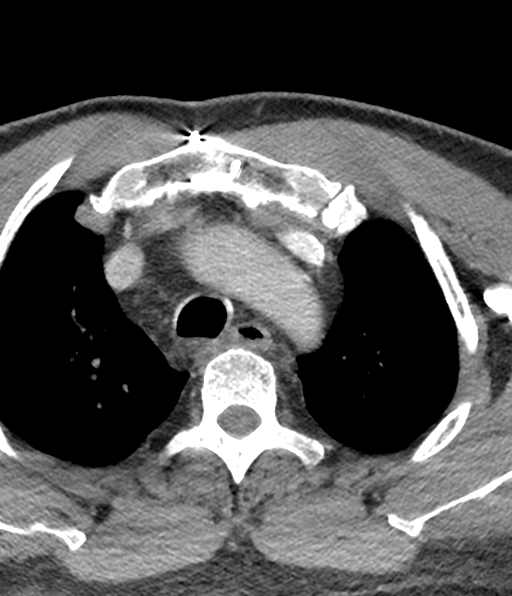
[im 17/118  bone]
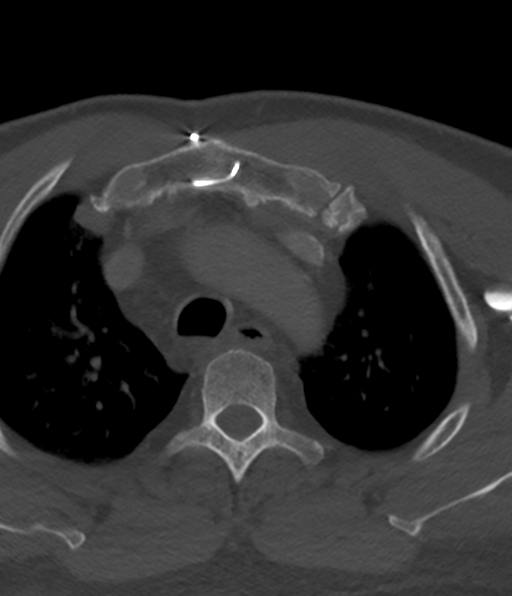
[im 34/118  bone]
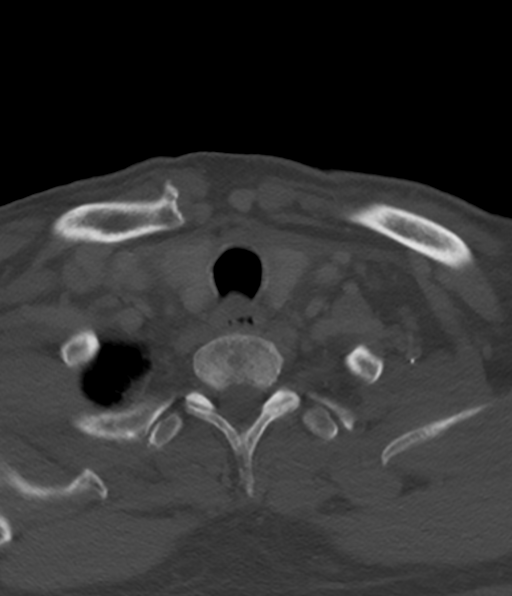
[im 67/118  bone]
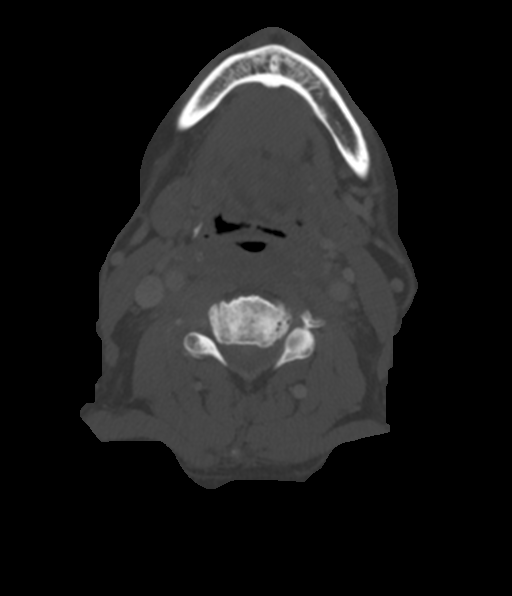
[im 84/118  bone]
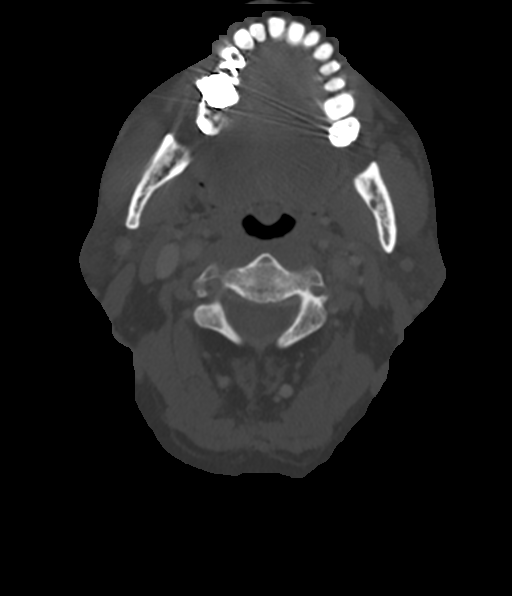
[im 101/118  soft-tissue]
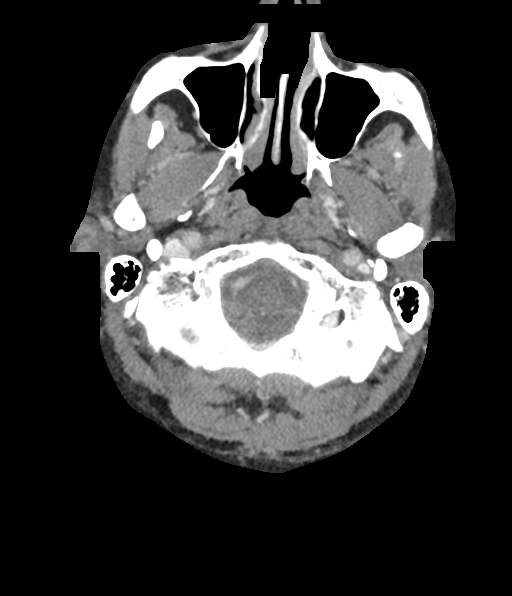
[im 101/118  bone]
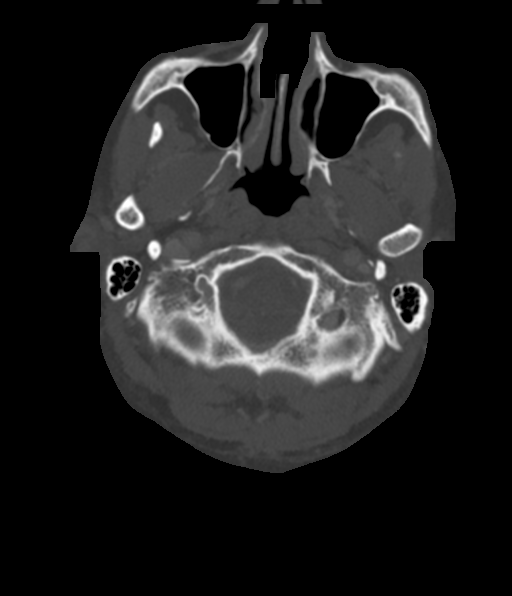

[13 of 33 positions shown; findings below may reference images not displayed]

FINDINGS: Pharynx and larynx: The patient shows evidence of inflammatory edema
and stranding within the retropharyngeal soft tissues from the
region of C2 to T1. Given the clinical history, this raises
considerable concern regarding aero digestive tract injury from the
swallowed tooth pick with regional inflammation. This appears to be
phlegmonous inflammation at this time without a clearly defined
low-density abscess. I am unable to clearly identify a foreign
object. If this was Fernanda Meeker tooth Dinger, Jalissa often shows air
density by CT. One could question a linear air shadow in the
right-side of the esophagus or esophageal wall at the thoracic inlet
level, but this is far from certain.

Salivary glands: Parotid and submandibular glands are normal.

Thyroid: Normal

Lymph nodes: No enlarged or low-density nodes on either side of the
neck.

Vascular: Ordinary mild atherosclerosis of the carotid bifurcations.

Limited intracranial: Negative

Visualized orbits: Normal

Mastoids and visualized paranasal sinuses: Clear

Skeleton: Ordinary cervical spondylosis.

Upper chest: Normal

Other: None
IMPRESSION: Inflammatory edema, stranding and swelling within the
retropharyngeal soft tissues throughout the neck. With this clinical
history, this raises considerable concern regarding aero digestive
tract injury from the swallowed tooth pick described by history.
This appears to represent phlegmonous inflammation without a
discretely defined abscess. Seng tooth picks can be very difficult
to see by CT. They may show air density. I do not definitely
identify that. One could question a linear air shadow in the right
side of the esophagus or esophageal wall at the thoracic inlet
level, but this is far from certain as there is some intraluminal
air also in this region.

## 2020-12-11 DIAGNOSIS — E7800 Pure hypercholesterolemia, unspecified: Secondary | ICD-10-CM | POA: Diagnosis not present

## 2020-12-11 DIAGNOSIS — Z23 Encounter for immunization: Secondary | ICD-10-CM | POA: Diagnosis not present

## 2020-12-11 DIAGNOSIS — E1165 Type 2 diabetes mellitus with hyperglycemia: Secondary | ICD-10-CM | POA: Diagnosis not present

## 2020-12-11 DIAGNOSIS — Z789 Other specified health status: Secondary | ICD-10-CM | POA: Diagnosis not present

## 2020-12-11 DIAGNOSIS — I10 Essential (primary) hypertension: Secondary | ICD-10-CM | POA: Diagnosis not present

## 2020-12-13 DIAGNOSIS — D225 Melanocytic nevi of trunk: Secondary | ICD-10-CM | POA: Diagnosis not present

## 2020-12-13 DIAGNOSIS — Z1283 Encounter for screening for malignant neoplasm of skin: Secondary | ICD-10-CM | POA: Diagnosis not present

## 2020-12-13 DIAGNOSIS — X32XXXD Exposure to sunlight, subsequent encounter: Secondary | ICD-10-CM | POA: Diagnosis not present

## 2020-12-13 DIAGNOSIS — L570 Actinic keratosis: Secondary | ICD-10-CM | POA: Diagnosis not present

## 2020-12-13 DIAGNOSIS — Z85820 Personal history of malignant melanoma of skin: Secondary | ICD-10-CM | POA: Diagnosis not present

## 2020-12-13 DIAGNOSIS — Z08 Encounter for follow-up examination after completed treatment for malignant neoplasm: Secondary | ICD-10-CM | POA: Diagnosis not present

## 2020-12-20 DIAGNOSIS — M47816 Spondylosis without myelopathy or radiculopathy, lumbar region: Secondary | ICD-10-CM | POA: Diagnosis not present

## 2020-12-20 DIAGNOSIS — M9903 Segmental and somatic dysfunction of lumbar region: Secondary | ICD-10-CM | POA: Diagnosis not present

## 2020-12-20 DIAGNOSIS — M545 Low back pain, unspecified: Secondary | ICD-10-CM | POA: Diagnosis not present

## 2020-12-20 DIAGNOSIS — M9901 Segmental and somatic dysfunction of cervical region: Secondary | ICD-10-CM | POA: Diagnosis not present

## 2020-12-20 DIAGNOSIS — M9902 Segmental and somatic dysfunction of thoracic region: Secondary | ICD-10-CM | POA: Diagnosis not present

## 2021-01-18 ENCOUNTER — Other Ambulatory Visit: Payer: Self-pay | Admitting: Cardiology

## 2021-01-22 DIAGNOSIS — E1165 Type 2 diabetes mellitus with hyperglycemia: Secondary | ICD-10-CM | POA: Diagnosis not present

## 2021-01-22 DIAGNOSIS — E7800 Pure hypercholesterolemia, unspecified: Secondary | ICD-10-CM | POA: Diagnosis not present

## 2021-01-22 DIAGNOSIS — Z789 Other specified health status: Secondary | ICD-10-CM | POA: Diagnosis not present

## 2021-01-22 DIAGNOSIS — I10 Essential (primary) hypertension: Secondary | ICD-10-CM | POA: Diagnosis not present

## 2021-01-24 DIAGNOSIS — M9902 Segmental and somatic dysfunction of thoracic region: Secondary | ICD-10-CM | POA: Diagnosis not present

## 2021-01-24 DIAGNOSIS — M47816 Spondylosis without myelopathy or radiculopathy, lumbar region: Secondary | ICD-10-CM | POA: Diagnosis not present

## 2021-01-24 DIAGNOSIS — M545 Low back pain, unspecified: Secondary | ICD-10-CM | POA: Diagnosis not present

## 2021-01-24 DIAGNOSIS — M9901 Segmental and somatic dysfunction of cervical region: Secondary | ICD-10-CM | POA: Diagnosis not present

## 2021-01-24 DIAGNOSIS — M9903 Segmental and somatic dysfunction of lumbar region: Secondary | ICD-10-CM | POA: Diagnosis not present

## 2021-02-22 ENCOUNTER — Other Ambulatory Visit: Payer: Self-pay

## 2021-02-22 ENCOUNTER — Ambulatory Visit (INDEPENDENT_AMBULATORY_CARE_PROVIDER_SITE_OTHER): Payer: Medicare Other | Admitting: Cardiology

## 2021-02-22 ENCOUNTER — Encounter: Payer: Self-pay | Admitting: Cardiology

## 2021-02-22 VITALS — BP 150/90 | HR 80 | Ht 67.0 in | Wt 184.4 lb

## 2021-02-22 DIAGNOSIS — I25118 Atherosclerotic heart disease of native coronary artery with other forms of angina pectoris: Secondary | ICD-10-CM

## 2021-02-22 DIAGNOSIS — E782 Mixed hyperlipidemia: Secondary | ICD-10-CM | POA: Diagnosis not present

## 2021-02-22 DIAGNOSIS — I10 Essential (primary) hypertension: Secondary | ICD-10-CM | POA: Diagnosis not present

## 2021-02-22 MED ORDER — AMLODIPINE BESYLATE 10 MG PO TABS
10.0000 mg | ORAL_TABLET | Freq: Every day | ORAL | 6 refills | Status: DC
Start: 2021-02-22 — End: 2021-08-24
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
## 2021-02-22 NOTE — Patient Instructions (Signed)
Medication Instructions:  Increase Norvasc to 10mg  daily Continue all other medications.     Labwork: none  Testing/Procedures: none  Follow-Up: 6 months   Any Other Special Instructions Will Be Listed Below (If Applicable).   If you need a refill on your cardiac medications before your next appointment, please call your pharmacy.

## 2021-02-27 DIAGNOSIS — M9903 Segmental and somatic dysfunction of lumbar region: Secondary | ICD-10-CM | POA: Diagnosis not present

## 2021-02-27 DIAGNOSIS — M9902 Segmental and somatic dysfunction of thoracic region: Secondary | ICD-10-CM | POA: Diagnosis not present

## 2021-02-27 DIAGNOSIS — M9901 Segmental and somatic dysfunction of cervical region: Secondary | ICD-10-CM | POA: Diagnosis not present

## 2021-02-27 DIAGNOSIS — M47816 Spondylosis without myelopathy or radiculopathy, lumbar region: Secondary | ICD-10-CM | POA: Diagnosis not present

## 2021-02-27 DIAGNOSIS — M545 Low back pain, unspecified: Secondary | ICD-10-CM | POA: Diagnosis not present

## 2021-03-03 ENCOUNTER — Other Ambulatory Visit: Payer: Self-pay | Admitting: Cardiology

## 2021-03-15 ENCOUNTER — Other Ambulatory Visit: Payer: Self-pay | Admitting: Cardiology

## 2021-03-27 DIAGNOSIS — M9903 Segmental and somatic dysfunction of lumbar region: Secondary | ICD-10-CM | POA: Diagnosis not present

## 2021-03-27 DIAGNOSIS — M47816 Spondylosis without myelopathy or radiculopathy, lumbar region: Secondary | ICD-10-CM | POA: Diagnosis not present

## 2021-03-27 DIAGNOSIS — M9901 Segmental and somatic dysfunction of cervical region: Secondary | ICD-10-CM | POA: Diagnosis not present

## 2021-03-27 DIAGNOSIS — M9902 Segmental and somatic dysfunction of thoracic region: Secondary | ICD-10-CM | POA: Diagnosis not present

## 2021-03-27 DIAGNOSIS — M545 Low back pain, unspecified: Secondary | ICD-10-CM | POA: Diagnosis not present

## 2021-04-02 ENCOUNTER — Other Ambulatory Visit: Payer: Self-pay | Admitting: Cardiology

## 2021-04-27 DIAGNOSIS — E1165 Type 2 diabetes mellitus with hyperglycemia: Secondary | ICD-10-CM | POA: Diagnosis not present

## 2021-04-27 DIAGNOSIS — E7800 Pure hypercholesterolemia, unspecified: Secondary | ICD-10-CM | POA: Diagnosis not present

## 2021-04-27 DIAGNOSIS — I10 Essential (primary) hypertension: Secondary | ICD-10-CM | POA: Diagnosis not present

## 2021-04-27 DIAGNOSIS — Z789 Other specified health status: Secondary | ICD-10-CM | POA: Diagnosis not present

## 2021-06-14 DIAGNOSIS — D225 Melanocytic nevi of trunk: Secondary | ICD-10-CM | POA: Diagnosis not present

## 2021-06-14 DIAGNOSIS — Z85820 Personal history of malignant melanoma of skin: Secondary | ICD-10-CM | POA: Diagnosis not present

## 2021-06-14 DIAGNOSIS — Z08 Encounter for follow-up examination after completed treatment for malignant neoplasm: Secondary | ICD-10-CM | POA: Diagnosis not present

## 2021-06-14 DIAGNOSIS — X32XXXD Exposure to sunlight, subsequent encounter: Secondary | ICD-10-CM | POA: Diagnosis not present

## 2021-06-14 DIAGNOSIS — Z1283 Encounter for screening for malignant neoplasm of skin: Secondary | ICD-10-CM | POA: Diagnosis not present

## 2021-06-14 DIAGNOSIS — L570 Actinic keratosis: Secondary | ICD-10-CM | POA: Diagnosis not present

## 2021-08-01 ENCOUNTER — Other Ambulatory Visit: Payer: Self-pay | Admitting: Cardiology

## 2021-08-24 ENCOUNTER — Encounter: Payer: Self-pay | Admitting: Cardiology

## 2021-08-24 ENCOUNTER — Ambulatory Visit (INDEPENDENT_AMBULATORY_CARE_PROVIDER_SITE_OTHER): Payer: Medicare Other | Admitting: Cardiology

## 2021-08-24 VITALS — BP 102/68 | HR 67 | Ht 67.0 in | Wt 185.2 lb

## 2021-08-24 DIAGNOSIS — I2510 Atherosclerotic heart disease of native coronary artery without angina pectoris: Secondary | ICD-10-CM

## 2021-08-24 DIAGNOSIS — I10 Essential (primary) hypertension: Secondary | ICD-10-CM

## 2021-08-24 DIAGNOSIS — E782 Mixed hyperlipidemia: Secondary | ICD-10-CM

## 2021-08-24 MED ORDER — AMLODIPINE BESYLATE 10 MG PO TABS: 10.0000 mg | ORAL_TABLET | Freq: Every day | ORAL | 3 refills | Status: DC

## 2021-08-24 NOTE — Patient Instructions (Signed)
Medication Instructions:  Continue all current medications.   Labwork: none  Testing/Procedures: none  Follow-Up: 6 months   Any Other Special Instructions Will Be Listed Below (If Applicable).   If you need a refill on your cardiac medications before your next appointment, please call your pharmacy.  
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
## 2021-09-07 ENCOUNTER — Other Ambulatory Visit: Payer: Self-pay | Admitting: Cardiology

## 2021-09-12 DIAGNOSIS — H524 Presbyopia: Secondary | ICD-10-CM | POA: Diagnosis not present

## 2021-09-12 DIAGNOSIS — E113292 Type 2 diabetes mellitus with mild nonproliferative diabetic retinopathy without macular edema, left eye: Secondary | ICD-10-CM | POA: Diagnosis not present

## 2021-09-12 DIAGNOSIS — H2513 Age-related nuclear cataract, bilateral: Secondary | ICD-10-CM | POA: Diagnosis not present

## 2021-10-09 DIAGNOSIS — Z1331 Encounter for screening for depression: Secondary | ICD-10-CM | POA: Diagnosis not present

## 2021-10-09 DIAGNOSIS — Z6828 Body mass index (BMI) 28.0-28.9, adult: Secondary | ICD-10-CM | POA: Diagnosis not present

## 2021-10-09 DIAGNOSIS — K219 Gastro-esophageal reflux disease without esophagitis: Secondary | ICD-10-CM | POA: Diagnosis not present

## 2021-10-09 DIAGNOSIS — E1165 Type 2 diabetes mellitus with hyperglycemia: Secondary | ICD-10-CM | POA: Diagnosis not present

## 2021-10-09 DIAGNOSIS — I10 Essential (primary) hypertension: Secondary | ICD-10-CM | POA: Diagnosis not present

## 2021-10-09 DIAGNOSIS — Z125 Encounter for screening for malignant neoplasm of prostate: Secondary | ICD-10-CM | POA: Diagnosis not present

## 2021-10-09 DIAGNOSIS — E7849 Other hyperlipidemia: Secondary | ICD-10-CM | POA: Diagnosis not present

## 2021-10-09 DIAGNOSIS — E782 Mixed hyperlipidemia: Secondary | ICD-10-CM | POA: Diagnosis not present

## 2021-10-09 DIAGNOSIS — Z0001 Encounter for general adult medical examination with abnormal findings: Secondary | ICD-10-CM | POA: Diagnosis not present

## 2021-10-09 DIAGNOSIS — I2510 Atherosclerotic heart disease of native coronary artery without angina pectoris: Secondary | ICD-10-CM | POA: Diagnosis not present

## 2021-10-09 DIAGNOSIS — Z794 Long term (current) use of insulin: Secondary | ICD-10-CM | POA: Diagnosis not present

## 2021-10-11 ENCOUNTER — Encounter: Payer: Self-pay | Admitting: *Deleted

## 2021-10-23 DIAGNOSIS — Z6828 Body mass index (BMI) 28.0-28.9, adult: Secondary | ICD-10-CM | POA: Diagnosis not present

## 2021-10-23 DIAGNOSIS — L237 Allergic contact dermatitis due to plants, except food: Secondary | ICD-10-CM | POA: Diagnosis not present

## 2021-10-23 DIAGNOSIS — E663 Overweight: Secondary | ICD-10-CM | POA: Diagnosis not present

## 2021-10-30 DIAGNOSIS — Z789 Other specified health status: Secondary | ICD-10-CM | POA: Diagnosis not present

## 2021-10-30 DIAGNOSIS — E1165 Type 2 diabetes mellitus with hyperglycemia: Secondary | ICD-10-CM | POA: Diagnosis not present

## 2021-10-30 DIAGNOSIS — E7800 Pure hypercholesterolemia, unspecified: Secondary | ICD-10-CM | POA: Diagnosis not present

## 2021-10-30 DIAGNOSIS — I10 Essential (primary) hypertension: Secondary | ICD-10-CM | POA: Diagnosis not present

## 2021-12-10 DIAGNOSIS — Z23 Encounter for immunization: Secondary | ICD-10-CM | POA: Diagnosis not present

## 2021-12-13 DIAGNOSIS — Z08 Encounter for follow-up examination after completed treatment for malignant neoplasm: Secondary | ICD-10-CM | POA: Diagnosis not present

## 2021-12-13 DIAGNOSIS — D225 Melanocytic nevi of trunk: Secondary | ICD-10-CM | POA: Diagnosis not present

## 2021-12-13 DIAGNOSIS — L570 Actinic keratosis: Secondary | ICD-10-CM | POA: Diagnosis not present

## 2021-12-13 DIAGNOSIS — C44319 Basal cell carcinoma of skin of other parts of face: Secondary | ICD-10-CM | POA: Diagnosis not present

## 2021-12-13 DIAGNOSIS — Z1283 Encounter for screening for malignant neoplasm of skin: Secondary | ICD-10-CM | POA: Diagnosis not present

## 2021-12-13 DIAGNOSIS — Z85820 Personal history of malignant melanoma of skin: Secondary | ICD-10-CM | POA: Diagnosis not present

## 2021-12-13 DIAGNOSIS — B356 Tinea cruris: Secondary | ICD-10-CM | POA: Diagnosis not present

## 2021-12-13 DIAGNOSIS — X32XXXD Exposure to sunlight, subsequent encounter: Secondary | ICD-10-CM | POA: Diagnosis not present

## 2022-01-24 DIAGNOSIS — X32XXXD Exposure to sunlight, subsequent encounter: Secondary | ICD-10-CM | POA: Diagnosis not present

## 2022-01-24 DIAGNOSIS — Z85828 Personal history of other malignant neoplasm of skin: Secondary | ICD-10-CM | POA: Diagnosis not present

## 2022-01-24 DIAGNOSIS — Z08 Encounter for follow-up examination after completed treatment for malignant neoplasm: Secondary | ICD-10-CM | POA: Diagnosis not present

## 2022-01-24 DIAGNOSIS — L570 Actinic keratosis: Secondary | ICD-10-CM | POA: Diagnosis not present

## 2022-02-22 ENCOUNTER — Encounter: Payer: Self-pay | Admitting: Cardiology

## 2022-02-22 ENCOUNTER — Ambulatory Visit: Payer: Medicare Other | Attending: Cardiology | Admitting: Cardiology

## 2022-02-22 VITALS — BP 122/78 | HR 66 | Ht 68.0 in | Wt 190.0 lb

## 2022-02-22 DIAGNOSIS — E782 Mixed hyperlipidemia: Secondary | ICD-10-CM | POA: Insufficient documentation

## 2022-02-22 DIAGNOSIS — I2510 Atherosclerotic heart disease of native coronary artery without angina pectoris: Secondary | ICD-10-CM | POA: Diagnosis not present

## 2022-02-22 DIAGNOSIS — I10 Essential (primary) hypertension: Secondary | ICD-10-CM | POA: Insufficient documentation
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
## 2022-02-22 NOTE — Patient Instructions (Addendum)

## 2022-02-26 DIAGNOSIS — M9903 Segmental and somatic dysfunction of lumbar region: Secondary | ICD-10-CM | POA: Diagnosis not present

## 2022-02-26 DIAGNOSIS — M47816 Spondylosis without myelopathy or radiculopathy, lumbar region: Secondary | ICD-10-CM | POA: Diagnosis not present

## 2022-03-11 ENCOUNTER — Other Ambulatory Visit: Payer: Self-pay | Admitting: Cardiology

## 2022-04-01 ENCOUNTER — Encounter: Payer: Self-pay | Admitting: *Deleted

## 2022-04-20 ENCOUNTER — Other Ambulatory Visit: Payer: Self-pay | Admitting: Cardiology

## 2022-04-30 DIAGNOSIS — E1165 Type 2 diabetes mellitus with hyperglycemia: Secondary | ICD-10-CM | POA: Diagnosis not present

## 2022-04-30 DIAGNOSIS — Z789 Other specified health status: Secondary | ICD-10-CM | POA: Diagnosis not present

## 2022-04-30 DIAGNOSIS — E7800 Pure hypercholesterolemia, unspecified: Secondary | ICD-10-CM | POA: Diagnosis not present

## 2022-04-30 DIAGNOSIS — I10 Essential (primary) hypertension: Secondary | ICD-10-CM | POA: Diagnosis not present

## 2022-05-03 DIAGNOSIS — E1165 Type 2 diabetes mellitus with hyperglycemia: Secondary | ICD-10-CM | POA: Diagnosis not present

## 2022-06-03 DIAGNOSIS — E1165 Type 2 diabetes mellitus with hyperglycemia: Secondary | ICD-10-CM | POA: Diagnosis not present

## 2022-07-03 DIAGNOSIS — E1165 Type 2 diabetes mellitus with hyperglycemia: Secondary | ICD-10-CM | POA: Diagnosis not present

## 2022-08-03 DIAGNOSIS — E1165 Type 2 diabetes mellitus with hyperglycemia: Secondary | ICD-10-CM | POA: Diagnosis not present

## 2022-09-02 DIAGNOSIS — E7800 Pure hypercholesterolemia, unspecified: Secondary | ICD-10-CM | POA: Diagnosis not present

## 2022-09-02 DIAGNOSIS — E1165 Type 2 diabetes mellitus with hyperglycemia: Secondary | ICD-10-CM | POA: Diagnosis not present

## 2022-09-02 DIAGNOSIS — Z789 Other specified health status: Secondary | ICD-10-CM | POA: Diagnosis not present

## 2022-09-02 DIAGNOSIS — I10 Essential (primary) hypertension: Secondary | ICD-10-CM | POA: Diagnosis not present

## 2022-09-05 DIAGNOSIS — D225 Melanocytic nevi of trunk: Secondary | ICD-10-CM | POA: Diagnosis not present

## 2022-09-05 DIAGNOSIS — L570 Actinic keratosis: Secondary | ICD-10-CM | POA: Diagnosis not present

## 2022-09-05 DIAGNOSIS — X32XXXD Exposure to sunlight, subsequent encounter: Secondary | ICD-10-CM | POA: Diagnosis not present

## 2022-09-05 DIAGNOSIS — Z08 Encounter for follow-up examination after completed treatment for malignant neoplasm: Secondary | ICD-10-CM | POA: Diagnosis not present

## 2022-09-05 DIAGNOSIS — Z85820 Personal history of malignant melanoma of skin: Secondary | ICD-10-CM | POA: Diagnosis not present

## 2022-09-05 DIAGNOSIS — Z1283 Encounter for screening for malignant neoplasm of skin: Secondary | ICD-10-CM | POA: Diagnosis not present

## 2022-09-12 ENCOUNTER — Other Ambulatory Visit: Payer: Self-pay | Admitting: Cardiology

## 2022-09-30 ENCOUNTER — Telehealth: Payer: Self-pay | Admitting: Cardiology

## 2022-09-30 MED ORDER — AMLODIPINE BESYLATE 10 MG PO TABS: 10.0000 mg | ORAL_TABLET | Freq: Every day | ORAL | 1 refills | Status: DC

## 2022-09-30 NOTE — Telephone Encounter (Signed)
*  STAT* If patient is at the pharmacy, call can be transferred to refill team.   1. Which medications need to be refilled? (please list name of each medication and dose if known)   amLODipine (NORVASC) 10 MG tablet     4. Which pharmacy/location (including street and city if local pharmacy) is medication to be sent to? [id number] [place] - [address] AT [facility] Phone: [phone]  Fax: [phone]       5. Do they need a 30 day or 90 day supply? 90  Pt scheduled to see Dr. Wyline Mood 02/03/23. She states they do not use [facility] anymore because they wont accept his insurance.

## 2022-10-03 DIAGNOSIS — I10 Essential (primary) hypertension: Secondary | ICD-10-CM | POA: Diagnosis not present

## 2022-10-03 DIAGNOSIS — M5416 Radiculopathy, lumbar region: Secondary | ICD-10-CM | POA: Diagnosis not present

## 2022-10-03 DIAGNOSIS — E1165 Type 2 diabetes mellitus with hyperglycemia: Secondary | ICD-10-CM | POA: Diagnosis not present

## 2022-10-03 DIAGNOSIS — E663 Overweight: Secondary | ICD-10-CM | POA: Diagnosis not present

## 2022-10-03 DIAGNOSIS — M5136 Other intervertebral disc degeneration, lumbar region: Secondary | ICD-10-CM | POA: Diagnosis not present

## 2022-10-03 DIAGNOSIS — M47816 Spondylosis without myelopathy or radiculopathy, lumbar region: Secondary | ICD-10-CM | POA: Diagnosis not present

## 2022-10-03 DIAGNOSIS — Z6828 Body mass index (BMI) 28.0-28.9, adult: Secondary | ICD-10-CM | POA: Diagnosis not present

## 2022-10-11 DIAGNOSIS — I10 Essential (primary) hypertension: Secondary | ICD-10-CM | POA: Diagnosis not present

## 2022-10-11 DIAGNOSIS — Z0001 Encounter for general adult medical examination with abnormal findings: Secondary | ICD-10-CM | POA: Diagnosis not present

## 2022-10-11 DIAGNOSIS — E1165 Type 2 diabetes mellitus with hyperglycemia: Secondary | ICD-10-CM | POA: Diagnosis not present

## 2022-10-11 DIAGNOSIS — Z6828 Body mass index (BMI) 28.0-28.9, adult: Secondary | ICD-10-CM | POA: Diagnosis not present

## 2022-10-11 DIAGNOSIS — Z1331 Encounter for screening for depression: Secondary | ICD-10-CM | POA: Diagnosis not present

## 2022-10-11 DIAGNOSIS — E7849 Other hyperlipidemia: Secondary | ICD-10-CM | POA: Diagnosis not present

## 2022-10-11 DIAGNOSIS — E6609 Other obesity due to excess calories: Secondary | ICD-10-CM | POA: Diagnosis not present

## 2022-10-15 ENCOUNTER — Encounter: Payer: Self-pay | Admitting: *Deleted

## 2022-10-29 ENCOUNTER — Telehealth: Payer: Self-pay | Admitting: Internal Medicine

## 2022-10-29 NOTE — Telephone Encounter (Signed)
Questionnaire is in review.

## 2022-10-30 NOTE — Addendum Note (Signed)
Addended by: Armstead Peaks on: 10/30/2022 09:24 AM   Modules accepted: Orders

## 2022-10-30 NOTE — Telephone Encounter (Signed)
  Procedure: Colonoscopy  Height: 5'8 Weight: 180lbs       Have you had a colonoscopy before?  2012, Dr. Karilyn Cota  Do you have family history of colon cancer?  no  Do you have a family history of polyps?   Previous colonoscopy with polyps removed?   Do you have a history colorectal cancer?   no  Are you diabetic?  Yes type 2   Do you have a prosthetic or mechanical heart valve? no  Do you have a pacemaker/defibrillator?   no  Have you had endocarditis/atrial fibrillation?  no  Do you use supplemental oxygen/CPAP?  no  Have you had joint replacement within the last 12 months?  no  Do you tend to be constipated or have to use laxatives?  no   Do you have history of alcohol use? If yes, how much and how often.  no  Do you have history or are you using drugs? If yes, what do are you  using?  no  Have you ever had a stroke/heart attack?  no  Have you ever had a heart or other vascular stent placed,?no  Do you take weight loss medication? no  Do you take any blood-thinning medications such as: (Plavix, aspirin, Coumadin, Aggrenox, Brilinta, Xarelto, Eliquis, Pradaxa, Savaysa or Effient)? Aspirin 81mg   If yes we need the name, milligram, dosage and who is prescribing doctor:               Current Outpatient Medications  Medication Sig Dispense Refill   amLODipine (NORVASC) 10 MG tablet Take 1 tablet (10 mg total) by mouth daily. 90 tablet 1   aspirin 81 MG tablet Take 81 mg by mouth daily.     atenolol (TENORMIN) 100 MG tablet TAKE 1 TABLET BY MOUTH EVERY DAY 90 tablet 2   dapagliflozin propanediol (FARXIGA) 10 MG TABS tablet Take by mouth. 5mg  every AM and 10mg  in pm     glimepiride (AMARYL) 4 MG tablet Take 4 mg by mouth 2 (two) times daily.     Multiple Vitamins-Minerals (MULTI FOR HIM PO) Take 1 tablet by mouth daily.     nabumetone (RELAFEN) 750 MG tablet Take 750 mg by mouth 2 (two) times daily. For arthritis     omeprazole (PRILOSEC) 40 MG capsule Take 40 mg by mouth  every morning.      rosuvastatin (CRESTOR) 20 MG tablet TAKE 1 TABLET BY MOUTH EVERY DAY 90 tablet 3   sitaGLIPtin-metformin (JANUMET) 50-500 MG tablet Take 1 tablet by mouth 2 (two) times daily with a meal.     trandolapril (MAVIK) 4 MG tablet TAKE 1 TABLET BY MOUTH EVERY DAY 90 tablet 3   TRESIBA FLEXTOUCH 100 UNIT/ML FlexTouch Pen Inject 36 Units into the skin daily at 6 (six) AM. On a sliding scale     No current facility-administered medications for this visit.    No Known Allergies

## 2022-11-03 DIAGNOSIS — E1165 Type 2 diabetes mellitus with hyperglycemia: Secondary | ICD-10-CM | POA: Diagnosis not present

## 2022-11-22 DIAGNOSIS — H2513 Age-related nuclear cataract, bilateral: Secondary | ICD-10-CM | POA: Diagnosis not present

## 2022-11-22 DIAGNOSIS — H52203 Unspecified astigmatism, bilateral: Secondary | ICD-10-CM | POA: Diagnosis not present

## 2022-11-22 DIAGNOSIS — E119 Type 2 diabetes mellitus without complications: Secondary | ICD-10-CM | POA: Diagnosis not present

## 2022-11-22 DIAGNOSIS — H25013 Cortical age-related cataract, bilateral: Secondary | ICD-10-CM | POA: Diagnosis not present

## 2022-11-27 NOTE — Telephone Encounter (Signed)
ASA 3. Needs OV prior to scheduling.

## 2022-12-03 DIAGNOSIS — E1165 Type 2 diabetes mellitus with hyperglycemia: Secondary | ICD-10-CM | POA: Diagnosis not present
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
## 2022-12-05 ENCOUNTER — Ambulatory Visit: Payer: HMO | Admitting: Gastroenterology

## 2022-12-05 ENCOUNTER — Encounter: Payer: Self-pay | Admitting: *Deleted

## 2022-12-05 ENCOUNTER — Encounter: Payer: Self-pay | Admitting: Gastroenterology

## 2022-12-05 ENCOUNTER — Other Ambulatory Visit: Payer: Self-pay | Admitting: *Deleted

## 2022-12-05 VITALS — BP 138/78 | HR 74 | Temp 97.7°F | Ht 67.0 in | Wt 187.8 lb

## 2022-12-05 DIAGNOSIS — Z860100 Personal history of colon polyps, unspecified: Secondary | ICD-10-CM

## 2022-12-05 MED ORDER — CLENPIQ 10-3.5-12 MG-GM -GM/175ML PO SOLN: 1.0000 | ORAL | 0 refills | Status: AC

## 2022-12-05 NOTE — Patient Instructions (Addendum)
We will get you scheduled for colonoscopy in the near future with Dr. Tasia Catchings at [facility].  You will need to hold [place] for 3 days prior to your procedure. 1 day prior to procedure: Janumet as prescribed, hold evening dose of glimepiride.  Take one half dose of [place]. Day of procedure: Do not take any morning diabetes medications.  Monitor your blood sugars while you are on clear liquids and correcting low blood sugars with improved sugary clear liquids.  We will follow-up with you as needed.  It was nice to meet you today!  Ermalinda Memos, PA-C [facility]

## 2022-12-12 DIAGNOSIS — Z6829 Body mass index (BMI) 29.0-29.9, adult: Secondary | ICD-10-CM | POA: Diagnosis not present

## 2022-12-12 DIAGNOSIS — J069 Acute upper respiratory infection, unspecified: Secondary | ICD-10-CM | POA: Diagnosis not present

## 2022-12-12 DIAGNOSIS — Z20828 Contact with and (suspected) exposure to other viral communicable diseases: Secondary | ICD-10-CM | POA: Diagnosis not present

## 2022-12-12 DIAGNOSIS — E6609 Other obesity due to excess calories: Secondary | ICD-10-CM | POA: Diagnosis not present

## 2022-12-18 ENCOUNTER — Encounter: Payer: Self-pay | Admitting: *Deleted

## 2022-12-18 ENCOUNTER — Telehealth: Payer: Self-pay | Admitting: *Deleted

## 2022-12-18 NOTE — Telephone Encounter (Signed)
Pt left vm wanting to reschedule his procedure.   Spoke with pt and he says he has another appointment on 12/27/22 in [place]. Pt has been rescheduled to 01/16/23. Updated instructions and pre-op appointment sent via MyChart.

## 2022-12-19 ENCOUNTER — Encounter: Payer: Self-pay | Admitting: *Deleted

## 2022-12-23 ENCOUNTER — Other Ambulatory Visit: Payer: Self-pay | Admitting: Cardiology

## 2022-12-25 ENCOUNTER — Encounter (HOSPITAL_COMMUNITY): Payer: HMO

## 2023-01-03 DIAGNOSIS — E1165 Type 2 diabetes mellitus with hyperglycemia: Secondary | ICD-10-CM | POA: Diagnosis not present

## 2023-01-09 NOTE — Patient Instructions (Signed)
Chad Blair  01/09/2023     @PREFPERIOPPHARMACY @   Your procedure is scheduled on  01/16/2023.   Report [facility] at  [phone]  A.M.   Call this number if you have problems the morning of surgery:  [phone]  If you experience any cold or flu symptoms such as cough, fever, chills, shortness of breath, etc. between now and your scheduled surgery, please notify us at the above number.   Remember:     Your last dose of farxiga should be on 01/12/2023.      Your last dose of sitagliptin-metformin shold be on 01/14/2023.      DO NOT take any medications for diabetes the morning of your procedure.   Follow the diet and prep instructions given to you by the office.   You may drink clear liquids until 0430 am on 01/16/2023.    Clear liquids allowed are:                    Water, Juice (No red color; non-citric and without pulp; diabetics please choose diet or no sugar options), Carbonated beverages (diabetics please choose diet or no sugar options), Clear Tea (No creamer, milk, or cream, including half & half and powdered creamer), Black Coffee Only (No creamer, milk or cream, including half & half and powdered creamer), and Clear Sports drink (No red color; diabetics please choose diet or no sugar options)    Take these medicines the morning of surgery with A SIP OF WATER             amlodipine, atenolol, nabumetone, omeprazole.    Do not wear jewelry, make-up or nail polish, including gel polish,  artificial nails, or any other type of covering on natural nails (fingers and  toes).  Do not wear lotions, powders, or perfumes, or deodorant.  Do not shave 48 hours prior to surgery.  Men may shave face and neck.  Do not bring valuables to the hospital.  [facility] is not responsible for any belongings or valuables.  Contacts, dentures or bridgework may not be worn into surgery.  Leave your suitcase in the car.  After surgery it may be brought to your room.  For  patients admitted to the hospital, discharge time will be determined by your treatment team.  Patients discharged the day of surgery will not be allowed to drive home and must  have someone with them for 24 hours.    Special instructions:   DO NOT smoke tobacco or vape for 24 hours before your procedure.  Please read over the following fact sheets that you were given. Anesthesia Post-op Instructions and Care and Recovery After Surgery      Colonoscopy, Adult, Care After The following information offers guidance on how to care for yourself after your procedure. Your health care provider may also give you more specific instructions. If you have problems or questions, contact your health care provider. What can I expect after the procedure? After the procedure, it is common to have: A small amount of blood in your stool for 24 hours after the procedure. Some gas. Mild cramping or bloating of your abdomen. Follow these instructions at home: Eating and drinking  Drink enough fluid to keep your urine pale yellow. Follow instructions from your health care provider about eating or drinking restrictions. Resume your normal diet as told by your health care provider. Avoid heavy or fried foods that are hard to digest.  Activity Rest as told by your health care provider. Avoid sitting for a long time without moving. Get up to take short walks every 1-2 hours. This is important to improve blood flow and breathing. Ask for help if you feel weak or unsteady. Return to your normal activities as told by your health care provider. Ask your health care provider what activities are safe for you. Managing cramping and bloating  Try walking around when you have cramps or feel bloated. If directed, apply heat to your abdomen as told by your health care provider. Use the heat source that your health care provider recommends, such as a moist heat pack or a heating pad. Place a towel between your skin and the  heat source. Leave the heat on for 20-30 minutes. Remove the heat if your skin turns bright red. This is especially important if you are unable to feel pain, heat, or cold. You have a greater risk of getting burned. General instructions If you were given a sedative during the procedure, it can affect you for several hours. Do not drive or operate machinery until your health care provider says that it is safe. For the first 24 hours after the procedure: Do not sign important documents. Do not drink alcohol. Do your regular daily activities at a slower pace than normal. Eat soft foods that are easy to digest. Take over-the-counter and prescription medicines only as told by your health care provider. Keep all follow-up visits. This is important. Contact a health care provider if: You have blood in your stool 2-3 days after the procedure. Get help right away if: You have more than a small spotting of blood in your stool. You have large blood clots in your stool. You have swelling of your abdomen. You have nausea or vomiting. You have a fever. You have increasing pain in your abdomen that is not relieved with medicine. These symptoms may be an emergency. Get help right away. Call 911. Do not wait to see if the symptoms will go away. Do not drive yourself to the hospital. Summary After the procedure, it is common to have a small amount of blood in your stool. You may also have mild cramping and bloating of your abdomen. If you were given a sedative during the procedure, it can affect you for several hours. Do not drive or operate machinery until your health care provider says that it is safe. Get help right away if you have a lot of blood in your stool, nausea or vomiting, a fever, or increased pain in your abdomen. This information is not intended to replace advice given to you by your health care provider. Make sure you discuss any questions you have with your health care provider. Document  Revised: 03/26/2022 Document Reviewed: 10/04/2020 Elsevier Patient Education  2024 [employer] Monitored Anesthesia Care, Care After The following information offers guidance on how to care for yourself after your procedure. Your health care provider may also give you more specific instructions. If you have problems or questions, contact your health care provider. What can I expect after the procedure? After the procedure, it is common to have: Tiredness. Little or no memory about what happened during or after the procedure. Impaired judgment when it comes to making decisions. Nausea or vomiting. Some trouble with balance. Follow these instructions at home: For the time period you were told by your health care provider:  Rest. Do not participate in activities where you could fall or become injured. Do not  drive or use machinery. Do not drink alcohol. Do not take sleeping pills or medicines that cause drowsiness. Do not make important decisions or sign legal documents. Do not take care of children on your own. Medicines Take over-the-counter and prescription medicines only as told by your health care provider. If you were prescribed antibiotics, take them as told by your health care provider. Do not stop using the antibiotic even if you start to feel better. Eating and drinking Follow instructions from your health care provider about what you may eat and drink. Drink enough fluid to keep your urine pale yellow. If you vomit: Drink clear fluids slowly and in small amounts as you are able. Clear fluids include water, ice chips, low-calorie sports drinks, and fruit juice that has water added to it (diluted fruit juice). Eat light and bland foods in small amounts as you are able. These foods include bananas, applesauce, rice, lean meats, toast, and crackers. General instructions  Have a responsible adult stay with you for the time you are told. It is important to have someone help care  for you until you are awake and alert. If you have sleep apnea, surgery and some medicines can increase your risk for breathing problems. Follow instructions from your health care provider about wearing your sleep device: When you are sleeping. This includes during daytime naps. While taking prescription pain medicines, sleeping medicines, or medicines that make you drowsy. Do not use any products that contain nicotine or tobacco. These products include cigarettes, chewing tobacco, and vaping devices, such as e-cigarettes. If you need help quitting, ask your health care provider. Contact a health care provider if: You feel nauseous or vomit every time you eat or drink. You feel light-headed. You are still sleepy or having trouble with balance after 24 hours. You get a rash. You have a fever. You have redness or swelling around the IV site. Get help right away if: You have trouble breathing. You have new confusion after you get home. These symptoms may be an emergency. Get help right away. Call 911. Do not wait to see if the symptoms will go away. Do not drive yourself to the hospital. This information is not intended to replace advice given to you by your health care provider. Make sure you discuss any questions you have with your health care provider. Document Revised: 07/09/2021 Document Reviewed: 07/09/2021 Elsevier Patient Education  2024 [employer].

## 2023-01-10 ENCOUNTER — Encounter (HOSPITAL_COMMUNITY)
Admission: RE | Admit: 2023-01-10 | Discharge: 2023-01-10 | Disposition: A | Discharge status: Home / Self Care | Payer: HMO | Source: Ambulatory Visit | Attending: Gastroenterology | Admitting: Gastroenterology

## 2023-01-10 ENCOUNTER — Encounter (HOSPITAL_COMMUNITY): Payer: Self-pay

## 2023-01-10 VITALS — BP 147/85 | HR 69 | Temp 97.8°F | Resp 18 | Ht 67.0 in | Wt 187.8 lb

## 2023-01-10 DIAGNOSIS — I10 Essential (primary) hypertension: Secondary | ICD-10-CM | POA: Insufficient documentation

## 2023-01-10 DIAGNOSIS — Z01818 Encounter for other preprocedural examination: Secondary | ICD-10-CM | POA: Diagnosis not present

## 2023-01-10 DIAGNOSIS — E118 Type 2 diabetes mellitus with unspecified complications: Secondary | ICD-10-CM | POA: Insufficient documentation

## 2023-01-10 LAB — BASIC METABOLIC PANEL
Anion gap: 9 (ref 5–15)
BUN: 18 mg/dL (ref 8–23)
CO2: 25 mmol/L (ref 22–32)
Calcium: 9.3 mg/dL (ref 8.9–10.3)
Chloride: 100 mmol/L (ref 98–111)
Creatinine, Ser: 0.91 mg/dL (ref 0.61–1.24)
GFR, Estimated: >60 mL/min (ref >60)
Glucose, Bld: 98 mg/dL (ref 70–99)
Potassium: 3.9 mmol/L (ref 3.5–5.1)
Sodium: 134 mmol/L — ABNORMAL LOW (ref 135–145)

## 2023-01-16 ENCOUNTER — Encounter (INDEPENDENT_AMBULATORY_CARE_PROVIDER_SITE_OTHER): Payer: Self-pay | Admitting: *Deleted

## 2023-01-16 ENCOUNTER — Ambulatory Visit (HOSPITAL_COMMUNITY): Payer: HMO | Admitting: Anesthesiology

## 2023-01-16 ENCOUNTER — Encounter (HOSPITAL_COMMUNITY)
Admission: RE | Disposition: A | Discharge status: Home / Self Care | Payer: Self-pay | Source: Home / Self Care | Attending: Gastroenterology

## 2023-01-16 ENCOUNTER — Encounter (HOSPITAL_COMMUNITY): Payer: Self-pay

## 2023-01-16 ENCOUNTER — Other Ambulatory Visit: Payer: Self-pay

## 2023-01-16 ENCOUNTER — Ambulatory Visit (HOSPITAL_COMMUNITY): Payer: Self-pay | Admitting: Anesthesiology

## 2023-01-16 ENCOUNTER — Ambulatory Visit (HOSPITAL_COMMUNITY)
Admission: RE | Admit: 2023-01-16 | Discharge: 2023-01-16 | Disposition: A | Discharge status: Home / Self Care | Payer: HMO | Attending: Gastroenterology | Admitting: Gastroenterology

## 2023-01-16 DIAGNOSIS — Z955 Presence of coronary angioplasty implant and graft: Secondary | ICD-10-CM | POA: Insufficient documentation

## 2023-01-16 DIAGNOSIS — K648 Other hemorrhoids: Secondary | ICD-10-CM | POA: Insufficient documentation

## 2023-01-16 DIAGNOSIS — Z1211 Encounter for screening for malignant neoplasm of colon: Secondary | ICD-10-CM | POA: Diagnosis not present

## 2023-01-16 DIAGNOSIS — K5730 Diverticulosis of large intestine without perforation or abscess without bleeding: Secondary | ICD-10-CM | POA: Insufficient documentation

## 2023-01-16 DIAGNOSIS — K279 Peptic ulcer, site unspecified, unspecified as acute or chronic, without hemorrhage or perforation: Secondary | ICD-10-CM | POA: Diagnosis not present

## 2023-01-16 DIAGNOSIS — Z860101 Personal history of adenomatous and serrated colon polyps: Secondary | ICD-10-CM | POA: Diagnosis not present

## 2023-01-16 DIAGNOSIS — Z7982 Long term (current) use of aspirin: Secondary | ICD-10-CM | POA: Insufficient documentation

## 2023-01-16 DIAGNOSIS — I10 Essential (primary) hypertension: Secondary | ICD-10-CM | POA: Diagnosis not present

## 2023-01-16 DIAGNOSIS — Z794 Long term (current) use of insulin: Secondary | ICD-10-CM | POA: Diagnosis not present

## 2023-01-16 DIAGNOSIS — I2510 Atherosclerotic heart disease of native coronary artery without angina pectoris: Secondary | ICD-10-CM | POA: Insufficient documentation

## 2023-01-16 DIAGNOSIS — Z791 Long term (current) use of non-steroidal anti-inflammatories (NSAID): Secondary | ICD-10-CM | POA: Insufficient documentation

## 2023-01-16 DIAGNOSIS — E119 Type 2 diabetes mellitus without complications: Secondary | ICD-10-CM | POA: Insufficient documentation

## 2023-01-16 DIAGNOSIS — Z7984 Long term (current) use of oral hypoglycemic drugs: Secondary | ICD-10-CM | POA: Insufficient documentation

## 2023-01-16 DIAGNOSIS — K644 Residual hemorrhoidal skin tags: Secondary | ICD-10-CM | POA: Insufficient documentation

## 2023-01-16 DIAGNOSIS — K449 Diaphragmatic hernia without obstruction or gangrene: Secondary | ICD-10-CM | POA: Insufficient documentation

## 2023-01-16 DIAGNOSIS — Z79899 Other long term (current) drug therapy: Secondary | ICD-10-CM | POA: Insufficient documentation

## 2023-01-16 DIAGNOSIS — K219 Gastro-esophageal reflux disease without esophagitis: Secondary | ICD-10-CM | POA: Diagnosis not present

## 2023-01-16 DIAGNOSIS — E118 Type 2 diabetes mellitus with unspecified complications: Secondary | ICD-10-CM

## 2023-01-16 DIAGNOSIS — Z951 Presence of aortocoronary bypass graft: Secondary | ICD-10-CM | POA: Insufficient documentation

## 2023-01-16 HISTORY — PX: COLONOSCOPY WITH PROPOFOL: SHX5780

## 2023-01-16 LAB — HM COLONOSCOPY

## 2023-01-16 LAB — GLUCOSE, CAPILLARY
Glucose-Capillary: 88 mg/dL (ref 70–99)
Glucose-Capillary: 97 mg/dL (ref 70–99)

## 2023-01-16 SURGERY — COLONOSCOPY WITH PROPOFOL
Anesthesia: General

## 2023-01-16 MED ORDER — PROPOFOL 10 MG/ML IV BOLUS
INTRAVENOUS | Status: DC | PRN
  Administered 2023-01-16: 70 mg via INTRAVENOUS
  Administered 2023-01-16: 30 mg via INTRAVENOUS

## 2023-01-16 MED ORDER — LACTATED RINGERS IV SOLN: INTRAVENOUS | Status: DC | PRN

## 2023-01-16 MED ORDER — PROPOFOL 500 MG/50ML IV EMUL
INTRAVENOUS | Status: DC | PRN
  Administered 2023-01-16: 100 ug/kg/min via INTRAVENOUS

## 2023-01-16 MED ORDER — LIDOCAINE HCL (CARDIAC) PF 100 MG/5ML IV SOSY
PREFILLED_SYRINGE | INTRAVENOUS | Status: DC | PRN
Start: 2023-01-16 — End: 2023-01-16
  Administered 2023-01-16: 60 mg via INTRAVENOUS

## 2023-01-16 MED ORDER — LACTATED RINGERS IV SOLN: INTRAVENOUS | Status: DC

## 2023-01-16 NOTE — Discharge Instructions (Signed)

## 2023-01-16 NOTE — Anesthesia Preprocedure Evaluation (Signed)
Anesthesia Evaluation  Patient identified by MRN, date of birth, ID band Patient awake    Reviewed: Allergy & Precautions, H&P , NPO status , Patient's Chart, lab work & pertinent test results, reviewed documented beta blocker date and time   Airway Mallampati: II  TM Distance: >3 FB Neck ROM: full    Dental no notable dental hx.    Pulmonary neg pulmonary ROS   Pulmonary exam normal breath sounds clear to auscultation       Cardiovascular Exercise Tolerance: Good hypertension, + CAD  + Valvular Problems/Murmurs  Rhythm:regular Rate:Normal     Neuro/Psych  Neuromuscular disease  negative psych ROS   GI/Hepatic Neg liver ROS, hiatal hernia, PUD,GERD  ,,  Endo/Other  diabetes    Renal/GU negative Renal ROS  negative genitourinary   Musculoskeletal   Abdominal   Peds  Hematology negative hematology ROS (+)   Anesthesia Other Findings   Reproductive/Obstetrics negative OB ROS                             Anesthesia Physical Anesthesia Plan  ASA: 3  Anesthesia Plan: General   Post-op Pain Management:    Induction:   PONV Risk Score and Plan: Propofol infusion  Airway Management Planned:   Additional Equipment:   Intra-op Plan:   Post-operative Plan:   Informed Consent: I have reviewed the patients History and Physical, chart, labs and discussed the procedure including the risks, benefits and alternatives for the proposed anesthesia with the patient or authorized representative who has indicated his/her understanding and acceptance.     Dental Advisory Given  Plan Discussed with: CRNA  Anesthesia Plan Comments:        Anesthesia Quick Evaluation
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
## 2023-01-16 NOTE — Op Note (Signed)
[facility] Patient Name: Chad Blair Procedure Date: 01/16/2023 7:58 AM MRN: [id number] Date of Birth: 08/10/1950 Attending MD: Sanjuan Dame , MD, [id number] CSN: [id number] Age: 72 Admit Type: Outpatient Procedure:                Colonoscopy Indications:              Screening for colorectal malignant neoplasm Providers:                Sanjuan Dame, MD, Francoise Ceo RN, RN, Angelica Ran,                            Elinor Parkinson Referring MD:              Medicines:                Monitored Anesthesia Care Complications:            No immediate complications. Estimated Blood Loss:     Estimated blood loss: none. Procedure:                Pre-Anesthesia Assessment:                           - Prior to the procedure, a History and Physical                            was performed, and patient medications and                            allergies were reviewed. The patient's tolerance of                            previous anesthesia was also reviewed. The risks                            and benefits of the procedure and the sedation                            options and risks were discussed with the patient.                            All questions were answered, and informed consent                            was obtained. Prior Anticoagulants: The patient has                            taken no anticoagulant or antiplatelet agents                            except for aspirin. ASA Grade Assessment: III - A                            patient with severe systemic disease. After  reviewing the risks and benefits, the patient was                            deemed in satisfactory condition to undergo the                            procedure.                           After obtaining informed consent, the colonoscope                            was passed under direct vision. Throughout the                            procedure, the patient's blood  pressure, pulse, and                            oxygen saturations were monitored continuously. The                            [id number]) scope was introduced through the                            anus and advanced to the the cecum, identified by                            appendiceal orifice and ileocecal valve. The                            colonoscopy was performed without difficulty. The                            patient tolerated the procedure well. The quality                            of the bowel preparation was evaluated using the                            BBPS [facility] Bowel Preparation Scale) with scores                            of: Right Colon = 3, Transverse Colon = 3 and Left                            Colon = 3 (entire mucosa seen well with no residual                            staining, small fragments of stool or opaque                            liquid). The total BBPS score equals 9. The  ileocecal valve, appendiceal orifice, and rectum                            were photographed. Scope In: 8:12:35 AM Scope Out: 8:33:58 AM Scope Withdrawal Time: 0 hours 16 minutes 47 seconds  Total Procedure Duration: 0 hours 21 minutes 23 seconds  Findings:      Scattered small-mouthed diverticula were found in the left colon.      Non-bleeding external and internal hemorrhoids were found during       retroflexion. The hemorrhoids were small. Impression:               - Diverticulosis in the left colon.                           - Non-bleeding external and internal hemorrhoids.                           - No specimens collected. Moderate Sedation:      Per Anesthesia Care Recommendation:           - Patient has a contact number available for                            emergencies. The signs and symptoms of potential                            delayed complications were discussed with the                            patient. Return to normal  activities tomorrow.                            Written discharge instructions were provided to the                            patient.                           - Resume previous diet.                           - Continue present medications.                           - Await pathology results.                           - No repeat colonoscopy due to current age (30                            years or older).                           - Return to primary care physician as previously                            scheduled. Procedure Code(s):        ---  Professional ---                           Z6109, Colorectal cancer screening; colonoscopy on                            individual not meeting criteria for high risk Diagnosis Code(s):        --- Professional ---                           Z12.11, Encounter for screening for malignant                            neoplasm of colon                           K64.8, Other hemorrhoids                           K57.30, Diverticulosis of large intestine without                            perforation or abscess without bleeding CPT copyright 2022 American Medical Association. All rights reserved. The codes documented in this report are preliminary and upon coder review may  be revised to meet current compliance requirements. Sanjuan Dame, MD Sanjuan Dame, MD 01/16/2023 8:38:46 AM This report has been signed electronically. Number of Addenda: 0

## 2023-01-16 NOTE — Transfer of Care (Signed)
Immediate Anesthesia Transfer of Care Note  Patient: Chad Blair  Procedure(s) Performed: COLONOSCOPY WITH PROPOFOL  Patient Location: PACU  Anesthesia Type:General  Level of Consciousness: awake, alert , drowsy, and patient cooperative  Airway & Oxygen Therapy: Patient Spontanous Breathing and Patient connected to nasal cannula oxygen  Post-op Assessment: Report given to RN, Post -op Vital signs reviewed and stable, and Patient moving all extremities X 4  Post vital signs: Reviewed and stable  Last Vitals:  Vitals Value Taken Time  BP 93/48 01/16/23 0835  Temp 36.6 C 01/16/23 0835  Pulse 61 01/16/23 0835  Resp 16 01/16/23 0835  SpO2 96 % 01/16/23 0835    Last Pain:  Vitals:   01/16/23 0835  TempSrc: Oral  PainSc:       Patients Stated Pain Goal: 4 (01/16/23 0721)  Complications: No notable events documented.

## 2023-01-16 NOTE — Anesthesia Postprocedure Evaluation (Signed)
Anesthesia Post Note  Patient: Chad Blair  Procedure(s) Performed: COLONOSCOPY WITH PROPOFOL  Patient location during evaluation: Phase II Anesthesia Type: General Level of consciousness: awake Pain management: pain level controlled Vital Signs Assessment: post-procedure vital signs reviewed and stable Respiratory status: spontaneous breathing and respiratory function stable Cardiovascular status: blood pressure returned to baseline and stable Postop Assessment: no headache and no apparent nausea or vomiting Anesthetic complications: no Comments: Late entry   No notable events documented.   Last Vitals:  Vitals:   01/16/23 0839 01/16/23 0847  BP: (!) 84/47 103/66  Pulse: (!) 59 68  Resp: 19 15  Temp:    SpO2: 95% 97%    Last Pain:  Vitals:   01/16/23 0842  TempSrc:   PainSc: 0-No pain                 Windell Norfolk

## 2023-01-21 DIAGNOSIS — I10 Essential (primary) hypertension: Secondary | ICD-10-CM | POA: Diagnosis not present

## 2023-01-21 DIAGNOSIS — Z6829 Body mass index (BMI) 29.0-29.9, adult: Secondary | ICD-10-CM | POA: Diagnosis not present

## 2023-01-21 DIAGNOSIS — M47816 Spondylosis without myelopathy or radiculopathy, lumbar region: Secondary | ICD-10-CM | POA: Diagnosis not present

## 2023-01-21 DIAGNOSIS — M5416 Radiculopathy, lumbar region: Secondary | ICD-10-CM | POA: Diagnosis not present

## 2023-01-21 DIAGNOSIS — M5134 Other intervertebral disc degeneration, thoracic region: Secondary | ICD-10-CM | POA: Diagnosis not present

## 2023-01-21 DIAGNOSIS — Z794 Long term (current) use of insulin: Secondary | ICD-10-CM | POA: Diagnosis not present

## 2023-01-21 DIAGNOSIS — E1165 Type 2 diabetes mellitus with hyperglycemia: Secondary | ICD-10-CM | POA: Diagnosis not present

## 2023-01-22 ENCOUNTER — Encounter (HOSPITAL_COMMUNITY): Payer: Self-pay | Admitting: Gastroenterology

## 2023-02-02 DIAGNOSIS — E1165 Type 2 diabetes mellitus with hyperglycemia: Secondary | ICD-10-CM | POA: Diagnosis not present

## 2023-02-03 ENCOUNTER — Other Ambulatory Visit (HOSPITAL_COMMUNITY): Payer: Self-pay | Admitting: Family Medicine

## 2023-02-03 ENCOUNTER — Ambulatory Visit (HOSPITAL_COMMUNITY)
Admission: RE | Admit: 2023-02-03 | Discharge: 2023-02-03 | Disposition: A | Discharge status: Home / Self Care | Payer: HMO | Source: Ambulatory Visit | Attending: Family Medicine | Admitting: Family Medicine

## 2023-02-03 ENCOUNTER — Ambulatory Visit: Payer: Medicare Other | Admitting: Cardiology

## 2023-02-03 DIAGNOSIS — Z6828 Body mass index (BMI) 28.0-28.9, adult: Secondary | ICD-10-CM | POA: Diagnosis not present

## 2023-02-03 DIAGNOSIS — E663 Overweight: Secondary | ICD-10-CM | POA: Diagnosis not present

## 2023-02-03 DIAGNOSIS — J069 Acute upper respiratory infection, unspecified: Secondary | ICD-10-CM | POA: Insufficient documentation

## 2023-02-03 DIAGNOSIS — R059 Cough, unspecified: Secondary | ICD-10-CM | POA: Diagnosis not present

## 2023-02-07 ENCOUNTER — Telehealth: Payer: Self-pay | Admitting: Cardiology

## 2023-02-10 NOTE — Telephone Encounter (Signed)
error 

## 2023-03-04 DIAGNOSIS — E7800 Pure hypercholesterolemia, unspecified: Secondary | ICD-10-CM | POA: Diagnosis not present

## 2023-03-04 DIAGNOSIS — E1165 Type 2 diabetes mellitus with hyperglycemia: Secondary | ICD-10-CM | POA: Diagnosis not present

## 2023-03-05 DIAGNOSIS — E1165 Type 2 diabetes mellitus with hyperglycemia: Secondary | ICD-10-CM | POA: Diagnosis not present

## 2023-03-06 DIAGNOSIS — L570 Actinic keratosis: Secondary | ICD-10-CM | POA: Diagnosis not present

## 2023-03-06 DIAGNOSIS — Z86006 Personal history of melanoma in-situ: Secondary | ICD-10-CM | POA: Diagnosis not present

## 2023-03-06 DIAGNOSIS — Z08 Encounter for follow-up examination after completed treatment for malignant neoplasm: Secondary | ICD-10-CM | POA: Diagnosis not present

## 2023-03-06 DIAGNOSIS — D225 Melanocytic nevi of trunk: Secondary | ICD-10-CM | POA: Diagnosis not present

## 2023-03-06 DIAGNOSIS — Z1283 Encounter for screening for malignant neoplasm of skin: Secondary | ICD-10-CM | POA: Diagnosis not present

## 2023-03-06 DIAGNOSIS — X32XXXD Exposure to sunlight, subsequent encounter: Secondary | ICD-10-CM | POA: Diagnosis not present

## 2023-03-07 ENCOUNTER — Other Ambulatory Visit: Payer: Self-pay | Admitting: Cardiology

## 2023-03-08 ENCOUNTER — Other Ambulatory Visit: Payer: Self-pay | Admitting: Cardiology

## 2023-03-10 DIAGNOSIS — E1165 Type 2 diabetes mellitus with hyperglycemia: Secondary | ICD-10-CM | POA: Diagnosis not present

## 2023-03-10 DIAGNOSIS — Z789 Other specified health status: Secondary | ICD-10-CM | POA: Diagnosis not present

## 2023-03-10 DIAGNOSIS — I10 Essential (primary) hypertension: Secondary | ICD-10-CM | POA: Diagnosis not present

## 2023-03-10 DIAGNOSIS — E7800 Pure hypercholesterolemia, unspecified: Secondary | ICD-10-CM | POA: Diagnosis not present

## 2023-03-12 ENCOUNTER — Encounter: Payer: Self-pay | Admitting: Cardiology

## 2023-03-12 ENCOUNTER — Ambulatory Visit: Payer: HMO | Attending: Cardiology | Admitting: Cardiology

## 2023-03-12 VITALS — BP 130/74 | HR 67 | Ht 67.0 in | Wt 183.6 lb

## 2023-03-12 DIAGNOSIS — I10 Essential (primary) hypertension: Secondary | ICD-10-CM | POA: Diagnosis not present

## 2023-03-12 DIAGNOSIS — I2510 Atherosclerotic heart disease of native coronary artery without angina pectoris: Secondary | ICD-10-CM

## 2023-03-12 DIAGNOSIS — E782 Mixed hyperlipidemia: Secondary | ICD-10-CM | POA: Diagnosis not present

## 2023-03-12 MED ORDER — TRANDOLAPRIL 4 MG PO TABS: 4.0000 mg | ORAL_TABLET | Freq: Every day | ORAL | 3 refills | Status: DC

## 2023-03-12 MED ORDER — AMLODIPINE BESYLATE 10 MG PO TABS: 10.0000 mg | ORAL_TABLET | Freq: Every day | ORAL | 3 refills | Status: DC

## 2023-03-12 MED ORDER — ROSUVASTATIN CALCIUM 40 MG PO TABS: 40.0000 mg | ORAL_TABLET | Freq: Every day | ORAL | 3 refills | Status: DC

## 2023-03-12 MED ORDER — ATENOLOL 100 MG PO TABS: 100.0000 mg | ORAL_TABLET | Freq: Every day | ORAL | 3 refills | Status: DC

## 2023-03-12 NOTE — Patient Instructions (Signed)
 Medication Instructions:  Your physician has recommended you make the following change in your medication:   -Increase Crestor  to 40 mg once daily.   *If you need a refill on your cardiac medications before your next appointment, please call your pharmacy*   Lab Work: None If you have labs (blood work) drawn today and your tests are completely normal, you will receive your results only by: MyChart Message (if you have MyChart) OR A paper copy in the mail If you have any lab test that is abnormal or we need to change your treatment, we will call you to review the results.   Testing/Procedures: NOne   Follow-Up: At [facility], you and your health needs are our priority.  As part of our continuing mission to provide you with exceptional heart care, we have created designated Provider Care Teams.  These Care Teams include your primary Cardiologist (physician) and Advanced Practice Providers (APPs -  Physician Assistants and Nurse Practitioners) who all work together to provide you with the care you need, when you need it.  We recommend signing up for the patient portal called "MyChart".  Sign up information is provided on this After Visit Summary.  MyChart is used to connect with patients for Virtual Visits (Telemedicine).  Patients are able to view lab/test results, encounter notes, upcoming appointments, etc.  Non-urgent messages can be sent to your provider as well.   To learn more about what you can do with MyChart, go to [email or web address].    Your next appointment:   6 month(s)  Provider:   You may see Armida Lander, MD or one of the following Advanced Practice Providers on your designated Care Team:   Woodfin Hays, PA-C  Theotis Flake, [place]     Other Instructions
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
## 2023-04-05 DIAGNOSIS — E1165 Type 2 diabetes mellitus with hyperglycemia: Secondary | ICD-10-CM | POA: Diagnosis not present

## 2023-05-03 DIAGNOSIS — E1165 Type 2 diabetes mellitus with hyperglycemia: Secondary | ICD-10-CM | POA: Diagnosis not present

## 2023-05-28 DIAGNOSIS — Z6829 Body mass index (BMI) 29.0-29.9, adult: Secondary | ICD-10-CM | POA: Diagnosis not present

## 2023-05-28 DIAGNOSIS — R6889 Other general symptoms and signs: Secondary | ICD-10-CM | POA: Diagnosis not present

## 2023-05-28 DIAGNOSIS — U071 COVID-19: Secondary | ICD-10-CM | POA: Diagnosis not present

## 2023-05-28 DIAGNOSIS — E6609 Other obesity due to excess calories: Secondary | ICD-10-CM | POA: Diagnosis not present

## 2023-06-03 DIAGNOSIS — E1165 Type 2 diabetes mellitus with hyperglycemia: Secondary | ICD-10-CM | POA: Diagnosis not present

## 2023-06-04 ENCOUNTER — Ambulatory Visit: Payer: HMO | Admitting: Cardiology

## 2023-06-05 DIAGNOSIS — E1165 Type 2 diabetes mellitus with hyperglycemia: Secondary | ICD-10-CM | POA: Diagnosis not present

## 2023-06-05 DIAGNOSIS — I10 Essential (primary) hypertension: Secondary | ICD-10-CM | POA: Diagnosis not present

## 2023-06-05 DIAGNOSIS — E7800 Pure hypercholesterolemia, unspecified: Secondary | ICD-10-CM | POA: Diagnosis not present

## 2023-06-05 DIAGNOSIS — Z789 Other specified health status: Secondary | ICD-10-CM | POA: Diagnosis not present

## 2023-07-03 DIAGNOSIS — E1165 Type 2 diabetes mellitus with hyperglycemia: Secondary | ICD-10-CM | POA: Diagnosis not present

## 2023-07-18 DIAGNOSIS — E6609 Other obesity due to excess calories: Secondary | ICD-10-CM | POA: Diagnosis not present

## 2023-07-18 DIAGNOSIS — M47816 Spondylosis without myelopathy or radiculopathy, lumbar region: Secondary | ICD-10-CM | POA: Diagnosis not present

## 2023-07-18 DIAGNOSIS — M5416 Radiculopathy, lumbar region: Secondary | ICD-10-CM | POA: Diagnosis not present

## 2023-07-18 DIAGNOSIS — Z6828 Body mass index (BMI) 28.0-28.9, adult: Secondary | ICD-10-CM | POA: Diagnosis not present

## 2023-08-03 DIAGNOSIS — E1165 Type 2 diabetes mellitus with hyperglycemia: Secondary | ICD-10-CM | POA: Diagnosis not present

## 2023-08-22 IMAGING — DX DG RIBS W/ CHEST 3+V*R*
3 series · 3 of 3 positions shown · non-contrast
Comparison: Chest radiograph, 10/04/2008.

CLINICAL DATA: Fall landed on rt side of chest Rt sided rib pain
(sharp)

EXAM:
RIGHT RIBS AND CHEST - 3 VIEW

[chest pa]
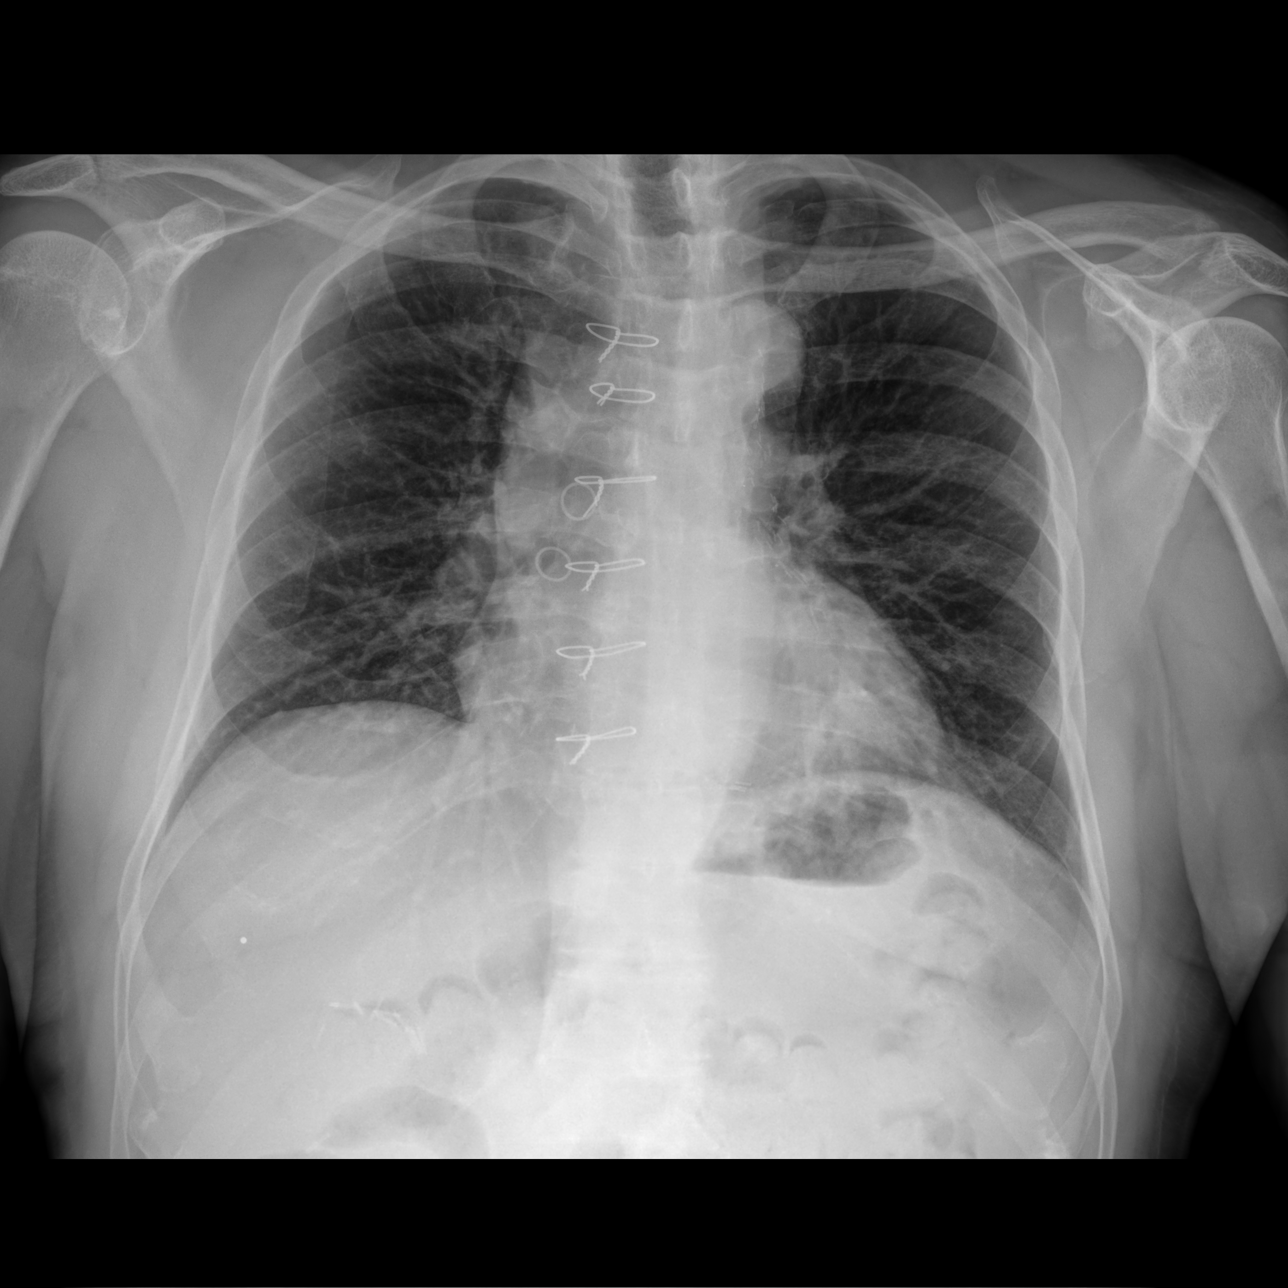

[hemithorax (ribs) ap]
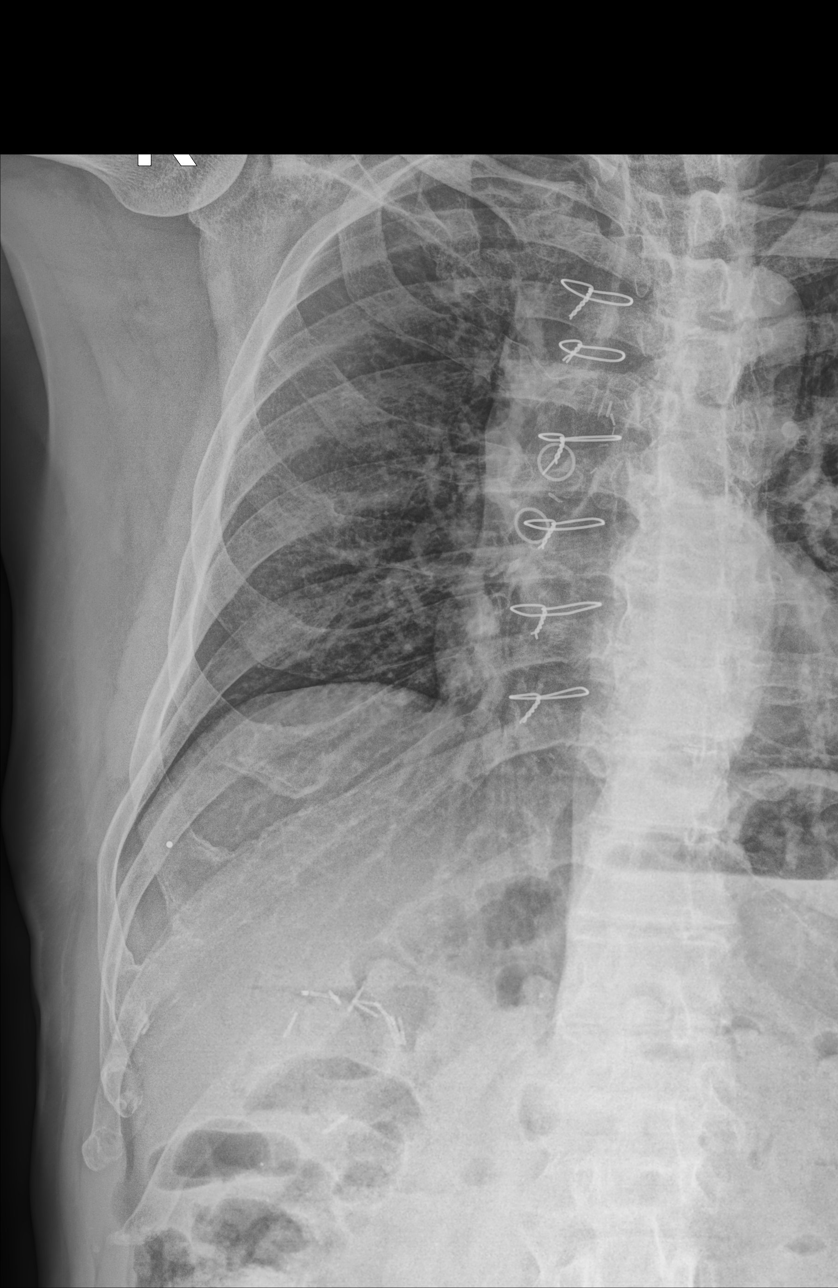

[hemithorax (ribs) mlo]
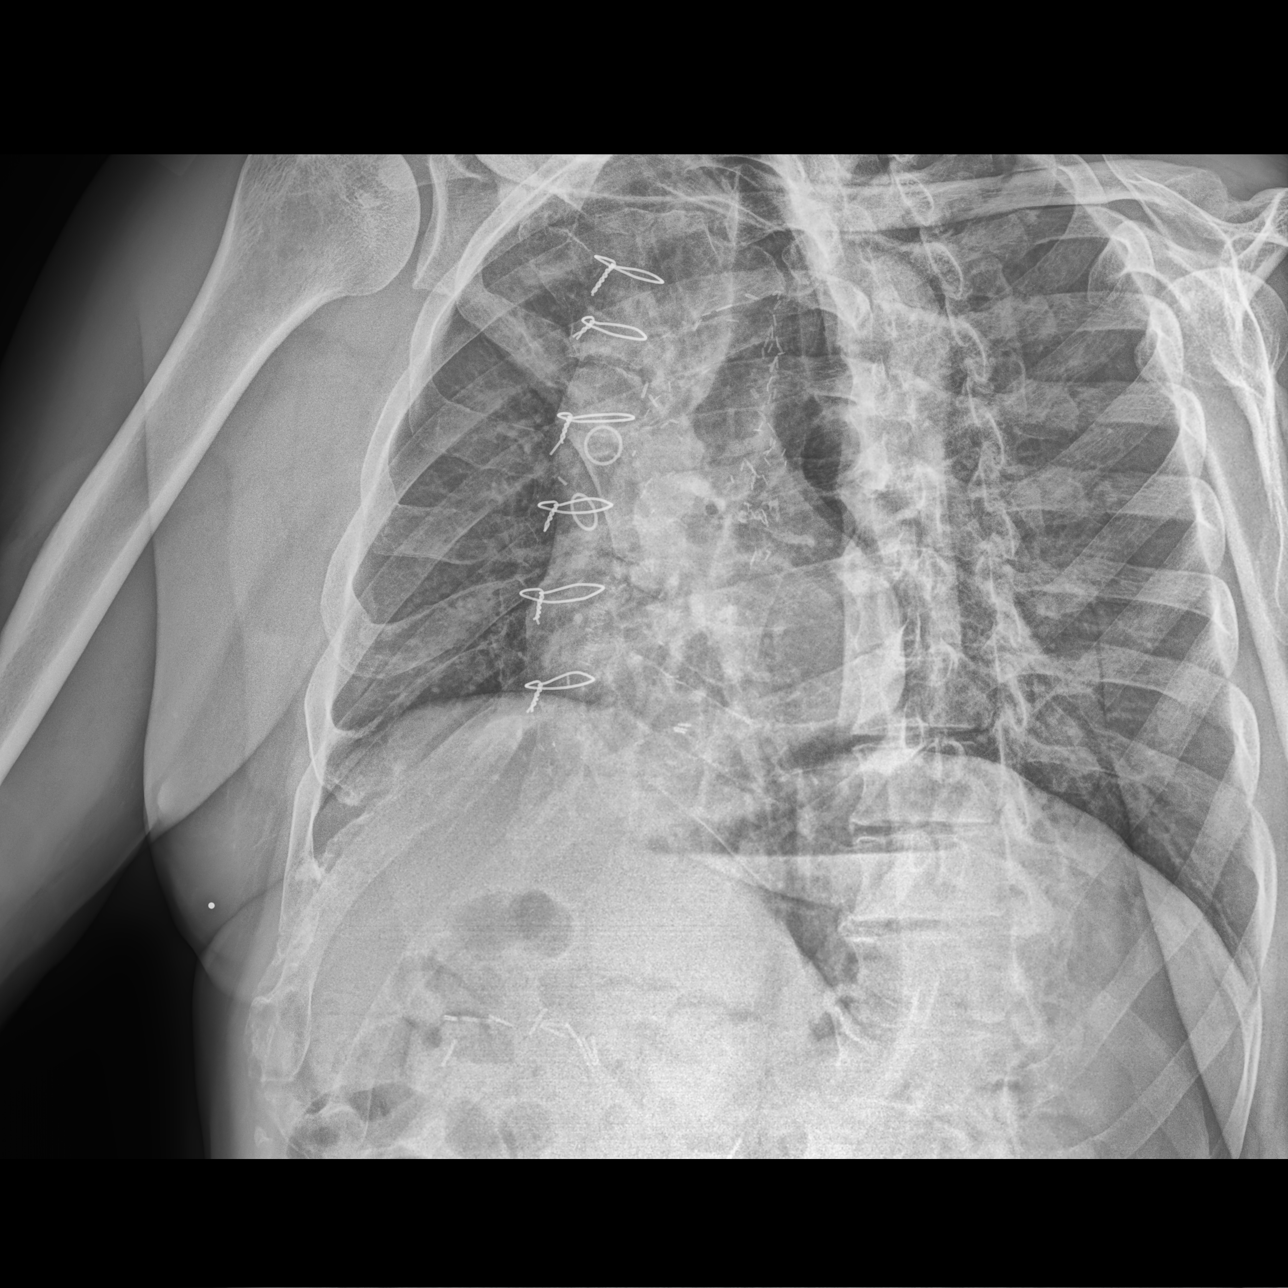

[3 of 3 positions shown; findings below may reference images not displayed]

FINDINGS: No fracture or bone lesion.

Stable changes from prior CABG surgery. Cardiac silhouette normal in
size. No mediastinal or hilar masses. Clear lungs. No pleural
effusion or pneumothorax.
IMPRESSION: 1. No rib fracture or rib lesion.
2. No active cardiopulmonary disease.

## 2023-09-02 DIAGNOSIS — E1165 Type 2 diabetes mellitus with hyperglycemia: Secondary | ICD-10-CM | POA: Diagnosis not present

## 2023-09-04 DIAGNOSIS — X32XXXD Exposure to sunlight, subsequent encounter: Secondary | ICD-10-CM | POA: Diagnosis not present

## 2023-09-04 DIAGNOSIS — L570 Actinic keratosis: Secondary | ICD-10-CM | POA: Diagnosis not present

## 2023-10-03 DIAGNOSIS — E1165 Type 2 diabetes mellitus with hyperglycemia: Secondary | ICD-10-CM | POA: Diagnosis not present

## 2023-10-08 ENCOUNTER — Encounter: Payer: Self-pay | Admitting: Cardiology

## 2023-10-08 ENCOUNTER — Ambulatory Visit: Attending: Cardiology | Admitting: Cardiology

## 2023-10-08 VITALS — BP 130/75 | HR 65 | Ht 66.0 in | Wt 179.0 lb

## 2023-10-08 DIAGNOSIS — E782 Mixed hyperlipidemia: Secondary | ICD-10-CM | POA: Diagnosis not present

## 2023-10-08 DIAGNOSIS — I2510 Atherosclerotic heart disease of native coronary artery without angina pectoris: Secondary | ICD-10-CM
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
## 2023-10-08 NOTE — Patient Instructions (Signed)
 Medication Instructions:  Your physician recommends that you continue on your current medications as directed. Please refer to the Current Medication list given to you today.   Labwork: Fasting Lipids  Testing/Procedures: None today  Follow-Up: 6 months Dr.Branch  Any Other Special Instructions Will Be Listed Below (If Applicable).  If you need a refill on your cardiac medications before your next appointment, please call your pharmacy.

## 2023-11-03 DIAGNOSIS — E1165 Type 2 diabetes mellitus with hyperglycemia: Secondary | ICD-10-CM | POA: Diagnosis not present

## 2023-11-10 DIAGNOSIS — E782 Mixed hyperlipidemia: Secondary | ICD-10-CM | POA: Diagnosis not present

## 2023-11-10 DIAGNOSIS — I2510 Atherosclerotic heart disease of native coronary artery without angina pectoris: Secondary | ICD-10-CM | POA: Diagnosis not present

## 2023-11-10 DIAGNOSIS — E1165 Type 2 diabetes mellitus with hyperglycemia: Secondary | ICD-10-CM | POA: Diagnosis not present

## 2023-11-11 LAB — LIPID PANEL
Chol/HDL Ratio: 3.7 ratio (ref 0.0–5.0)
Cholesterol, Total: 156 mg/dL (ref 100–199)
HDL: 42 mg/dL (ref >39)
LDL Chol Calc (NIH): 89 mg/dL (ref 0–99)
Triglycerides: 141 mg/dL (ref 0–149)
VLDL Cholesterol Cal: 25 mg/dL (ref 5–40)

## 2023-11-17 DIAGNOSIS — Z789 Other specified health status: Secondary | ICD-10-CM | POA: Diagnosis not present

## 2023-11-17 DIAGNOSIS — E7800 Pure hypercholesterolemia, unspecified: Secondary | ICD-10-CM | POA: Diagnosis not present

## 2023-11-17 DIAGNOSIS — I10 Essential (primary) hypertension: Secondary | ICD-10-CM | POA: Diagnosis not present

## 2023-11-17 DIAGNOSIS — Z23 Encounter for immunization: Secondary | ICD-10-CM | POA: Diagnosis not present

## 2023-11-17 DIAGNOSIS — E1165 Type 2 diabetes mellitus with hyperglycemia: Secondary | ICD-10-CM | POA: Diagnosis not present

## 2023-11-28 DIAGNOSIS — H2513 Age-related nuclear cataract, bilateral: Secondary | ICD-10-CM | POA: Diagnosis not present

## 2023-11-28 DIAGNOSIS — E119 Type 2 diabetes mellitus without complications: Secondary | ICD-10-CM | POA: Diagnosis not present

## 2023-11-28 DIAGNOSIS — H524 Presbyopia: Secondary | ICD-10-CM | POA: Diagnosis not present

## 2023-11-28 DIAGNOSIS — H52203 Unspecified astigmatism, bilateral: Secondary | ICD-10-CM | POA: Diagnosis not present

## 2023-12-03 ENCOUNTER — Ambulatory Visit: Payer: Self-pay | Admitting: Cardiology

## 2023-12-03 ENCOUNTER — Other Ambulatory Visit: Payer: Self-pay

## 2023-12-03 DIAGNOSIS — E1165 Type 2 diabetes mellitus with hyperglycemia: Secondary | ICD-10-CM | POA: Diagnosis not present

## 2023-12-03 MED ORDER — EZETIMIBE 10 MG PO TABS: 10.0000 mg | ORAL_TABLET | Freq: Every day | ORAL | 3 refills | Status: AC

## 2023-12-31 DIAGNOSIS — M4716 Other spondylosis with myelopathy, lumbar region: Secondary | ICD-10-CM | POA: Diagnosis not present

## 2023-12-31 DIAGNOSIS — Z125 Encounter for screening for malignant neoplasm of prostate: Secondary | ICD-10-CM | POA: Diagnosis not present

## 2024-01-03 DIAGNOSIS — E1165 Type 2 diabetes mellitus with hyperglycemia: Secondary | ICD-10-CM | POA: Diagnosis not present

## 2024-03-01 ENCOUNTER — Other Ambulatory Visit: Payer: Self-pay | Admitting: Cardiology

## 2024-04-05 ENCOUNTER — Ambulatory Visit: Admitting: Cardiology

## 2024-04-12 ENCOUNTER — Ambulatory Visit: Admitting: Cardiology
